# Patient Record
Sex: Female | Born: 1940 | Race: White | Hispanic: No | Marital: Married | Smoking: Never smoker
Health system: Southern US, Community
[De-identification: ages and names within clinical notes are randomized; demographics above are authoritative.]

## PROBLEM LIST (undated history)

## (undated) DIAGNOSIS — E785 Hyperlipidemia, unspecified: Secondary | ICD-10-CM

## (undated) DIAGNOSIS — R079 Chest pain, unspecified: Secondary | ICD-10-CM

## (undated) DIAGNOSIS — K311 Adult hypertrophic pyloric stenosis: Secondary | ICD-10-CM

## (undated) DIAGNOSIS — I447 Left bundle-branch block, unspecified: Secondary | ICD-10-CM

## (undated) DIAGNOSIS — J339 Nasal polyp, unspecified: Secondary | ICD-10-CM

## (undated) DIAGNOSIS — I6529 Occlusion and stenosis of unspecified carotid artery: Secondary | ICD-10-CM

## (undated) HISTORY — DX: Adult hypertrophic pyloric stenosis: K31.1

## (undated) HISTORY — DX: Left bundle-branch block, unspecified: I44.7

## (undated) HISTORY — DX: Nasal polyp, unspecified: J33.9

## (undated) HISTORY — DX: Chest pain, unspecified: R07.9

## (undated) HISTORY — DX: Hyperlipidemia, unspecified: E78.5

## (undated) HISTORY — DX: Occlusion and stenosis of unspecified carotid artery: I65.29

---

## 1990-09-18 HISTORY — PX: OTHER SURGICAL HISTORY: SHX169

## 2001-09-27 ENCOUNTER — Other Ambulatory Visit: Admission: RE | Admit: 2001-09-27 | Discharge: 2001-09-27 | Payer: Self-pay | Admitting: *Deleted

## 2003-10-19 ENCOUNTER — Other Ambulatory Visit: Admission: RE | Admit: 2003-10-19 | Discharge: 2003-10-19 | Payer: Self-pay | Admitting: *Deleted

## 2006-01-29 ENCOUNTER — Other Ambulatory Visit: Admission: RE | Admit: 2006-01-29 | Discharge: 2006-01-29 | Payer: Self-pay | Admitting: *Deleted

## 2007-09-19 HISTORY — PX: NASAL POLYP SURGERY: SHX186

## 2008-09-02 ENCOUNTER — Other Ambulatory Visit: Admission: RE | Admit: 2008-09-02 | Discharge: 2008-09-02 | Payer: Self-pay | Admitting: Family Medicine

## 2013-07-08 ENCOUNTER — Encounter (HOSPITAL_COMMUNITY): Payer: Self-pay | Admitting: Emergency Medicine

## 2013-07-08 ENCOUNTER — Emergency Department (HOSPITAL_COMMUNITY): Payer: Medicare Other

## 2013-07-08 ENCOUNTER — Observation Stay (HOSPITAL_COMMUNITY)
Admission: EM | Admit: 2013-07-08 | Discharge: 2013-07-09 | Disposition: A | Discharge status: Home / Self Care | Payer: Medicare Other | Attending: Internal Medicine | Admitting: Internal Medicine

## 2013-07-08 DIAGNOSIS — Z79899 Other long term (current) drug therapy: Secondary | ICD-10-CM | POA: Insufficient documentation

## 2013-07-08 DIAGNOSIS — R42 Dizziness and giddiness: Secondary | ICD-10-CM | POA: Insufficient documentation

## 2013-07-08 DIAGNOSIS — R0602 Shortness of breath: Secondary | ICD-10-CM | POA: Insufficient documentation

## 2013-07-08 DIAGNOSIS — I447 Left bundle-branch block, unspecified: Secondary | ICD-10-CM | POA: Diagnosis present

## 2013-07-08 DIAGNOSIS — R0789 Other chest pain: Principal | ICD-10-CM | POA: Insufficient documentation

## 2013-07-08 DIAGNOSIS — R079 Chest pain, unspecified: Secondary | ICD-10-CM | POA: Diagnosis present

## 2013-07-08 DIAGNOSIS — K219 Gastro-esophageal reflux disease without esophagitis: Secondary | ICD-10-CM | POA: Diagnosis present

## 2013-07-08 DIAGNOSIS — Z7982 Long term (current) use of aspirin: Secondary | ICD-10-CM | POA: Insufficient documentation

## 2013-07-08 LAB — BASIC METABOLIC PANEL
BUN: 16 mg/dL (ref 6–23)
CO2: 29 mEq/L (ref 19–32)
Chloride: 101 mEq/L (ref 96–112)
Creatinine, Ser: 0.77 mg/dL (ref 0.50–1.10)
GFR calc non Af Amer: 84 mL/min — ABNORMAL LOW (ref >90)
Glucose, Bld: 86 mg/dL (ref 70–99)
Potassium: 3.8 mEq/L (ref 3.5–5.1)
Sodium: 139 mEq/L (ref 135–145)

## 2013-07-08 LAB — CBC WITH DIFFERENTIAL/PLATELET
Eosinophils Absolute: 0.2 10*3/uL (ref 0.0–0.7)
Hemoglobin: 13.2 g/dL (ref 12.0–15.0)
Lymphocytes Relative: 39 % (ref 12–46)
Lymphs Abs: 2.5 10*3/uL (ref 0.7–4.0)
MCH: 30.8 pg (ref 26.0–34.0)
MCHC: 34.6 g/dL (ref 30.0–36.0)
Monocytes Relative: 10 % (ref 3–12)
Neutro Abs: 3.2 10*3/uL (ref 1.7–7.7)
Neutrophils Relative %: 48 % (ref 43–77)
Platelets: 167 10*3/uL (ref 150–400)
RBC: 4.28 MIL/uL (ref 3.87–5.11)
WBC: 6.5 10*3/uL (ref 4.0–10.5)

## 2013-07-08 LAB — TROPONIN I: Troponin I: <0.3 ng/mL (ref <0.30)

## 2013-07-08 MED ORDER — ENOXAPARIN SODIUM 40 MG/0.4ML ~~LOC~~ SOLN
40.0000 mg | SUBCUTANEOUS | Status: DC
  Administered 2013-07-08: 40 mg via SUBCUTANEOUS
  Filled 2013-07-08 (×2): qty 0.4

## 2013-07-08 MED ORDER — ACETAMINOPHEN 325 MG PO TABS: 650.0000 mg | ORAL_TABLET | Freq: Four times a day (QID) | ORAL | Status: DC | PRN

## 2013-07-08 MED ORDER — ACETAMINOPHEN 650 MG RE SUPP: 650.0000 mg | Freq: Four times a day (QID) | RECTAL | Status: DC | PRN

## 2013-07-08 MED ORDER — ASPIRIN EC 81 MG PO TBEC
81.0000 mg | DELAYED_RELEASE_TABLET | Freq: Every day | ORAL | Status: DC
  Administered 2013-07-09: 81 mg via ORAL
  Filled 2013-07-08: qty 1

## 2013-07-08 NOTE — ED Notes (Signed)
Pt returned from xray

## 2013-07-08 NOTE — H&P (Signed)
[facility] History and Physical  Jodi Owen UXL:[id number] DOB: September 07, 1941 DOA: 07/08/2013  Referring physician: Bernette Mayers PCP: Gaye Alken, MD    Chief Complaint: chest pain  HPI: Jodi Owen is a 72 y.o. female presents with substernal chest pressure. She was driving when it occurred. She felt lightheaded and weak. She had no palpitations. She took for baby aspirin this. The pain resolved after about 30 minutes. In the emergency room, she had EKG which showed left bundle branch block (old). Troponin normal. She had a stress test about a year ago with [facility] cardiology which was normal according to Dr. Mayford Knife.   Review of Systems: systems reviewed. As above. Otherwise negative  History reviewed. No pertinent past medical history. Surgical hx: surgery for pyloric stenosis as a child  Social History:  reports that she has never smoked. She does not have any smokeless tobacco history on file. She reports that she drinks alcohol. She reports that she does not use illicit drugs. Married. Retired principal.   Allergies  Allergen Reactions  . Codeine     hallucinations    Family History  Problem Relation Age of Onset  . AAA (abdominal aortic aneurysm) Mother     weight  . Hypertension Mother   . Lung cancer Father     Prior to Admission medications   Medication Sig Start Date End Date Taking? Authorizing Provider  aspirin EC 81 MG tablet Take 81 mg by mouth daily.   Yes Historical Provider, MD  Cholecalciferol (VITAMIN D) 2000 UNITS tablet Take 2,000 Units by mouth daily.   Yes Historical Provider, MD  fish oil-omega-3 fatty acids 1000 MG capsule Take 1 g by mouth daily.   Yes Historical Provider, MD  Multiple Vitamin (MULTIVITAMIN WITH MINERALS) TABS tablet Take 1 tablet by mouth daily.   Yes Historical Provider, MD   Physical Exam: Filed Vitals:   07/08/13 1623  BP: 137/86  Pulse: 69  Temp:   Resp: 15   BP 137/86  Pulse 69  Temp(Src) 98.3 F  (36.8 C)  Resp 15  SpO2 99%  General Appearance:    Alert, cooperative, no distress, appears stated age  Head:    Normocephalic, without obvious abnormality, atraumatic  Eyes:    PERRL, conjunctiva/corneas clear, EOM's intact, fundi    benign, both eyes  Ears:    Normal TM's and external ear canals, both ears  Nose:   Nares normal, septum midline, mucosa normal, no drainage    or sinus tenderness  Throat:   Lips, mucosa, and tongue normal; teeth and gums normal  Neck:   Supple, symmetrical, trachea midline, no adenopathy;    thyroid:  no enlargement/tenderness/nodules; no carotid   bruit or JVD  Back:     Symmetric, no curvature, ROM normal, no CVA tenderness  Lungs:     Clear to auscultation bilaterally, respirations unlabored  Chest Wall:    No tenderness or deformity   Heart:    Regular rate and rhythm, S1 and S2 normal, no murmur, rub   or gallop     Abdomen:     Soft, non-tender, bowel sounds active all four quadrants,    no masses, no organomegaly  Genitalia:  deferred  Rectal:  deferred  Extremities:   Extremities normal, atraumatic, no cyanosis or edema  Pulses:   2+ and symmetric all extremities  Skin:   Skin color, texture, turgor normal, no rashes or lesions  Lymph nodes:   Cervical, supraclavicular, and axillary nodes normal  Neurologic:   CNII-XII intact, normal strength, sensation and reflexes    throughout    Psych: normal affect  Labs on Admission:  Basic Metabolic Panel:  Recent Labs Lab 07/08/13 1442  NA 139  K 3.8  CL 101  CO2 29  GLUCOSE 86  BUN 16  CREATININE 0.77  CALCIUM 9.5   Liver Function Tests: No results found for this basename: AST, ALT, ALKPHOS, BILITOT, PROT, ALBUMIN,  in the last 168 hours No results found for this basename: LIPASE, AMYLASE,  in the last 168 hours No results found for this basename: AMMONIA,  in the last 168 hours CBC:  Recent Labs Lab 07/08/13 1442  WBC 6.5  NEUTROABS 3.2  HGB 13.2  HCT 38.2  MCV 89.3   PLT 167   Cardiac Enzymes:  Recent Labs Lab 07/08/13 1442  TROPONINI <0.30    BNP (last 3 results) No results found for this basename: PROBNP,  in the last 8760 hours CBG: No results found for this basename: GLUCAP,  in the last 168 hours  Radiological Exams on Admission: Dg Chest 2 View  07/08/2013   CLINICAL DATA:  Weakness, chest pain  EXAM: CHEST  2 VIEW  COMPARISON:  None.  FINDINGS: Cardiomediastinal silhouette is unremarkable. No acute infiltrate or pleural effusion. No pulmonary edema. Mild hyperinflation. Bony thorax is unremarkable. There is nodular calcification in right nipple region. Repeat frontal view with nipple markers recommended for confirmation.  IMPRESSION: No acute infiltrate or pleural effusion. No pulmonary edema. Mild hyperinflation. Bony thorax is unremarkable. There is nodular calcification in right nipple region. Repeat frontal view with nipple markers recommended for confirmation.   Electronically Signed   By: Natasha Mead M.D.   On: 07/08/2013 15:23    EKG: NSR. LBBB  Assessment/Plan Principal Problem:   Chest pain r/o MI. Atypical. obs on tele. Home in am if negative.  Code Status: full Family Communication: none Disposition Plan: home  Time spent: 45 min  Kendi Defalco L [facility] Pager [phone]  If 7PM-7AM, please contact night-coverage [email or web address] Password [facility] 07/08/2013, 4:45 PM

## 2013-07-08 NOTE — ED Notes (Signed)
Patient transported to X-ray 

## 2013-07-08 NOTE — ED Provider Notes (Signed)
CSN: [id number]     Arrival date & time 07/08/13  1415 History   First MD Initiated Contact with Patient 07/08/13 1412     Chief Complaint  Patient presents with  . Chest Pain   (Consider location/radiation/quality/duration/timing/severity/associated sxs/prior Treatment) Patient is a 72 y.o. female presenting with chest pain.  Chest Pain  Pt with no significant PMH reports she had onset of moderate to severe mid chest pressure associated with SOB and lightheadedness while driving. She took 324mg  ASA at that time and drove back to her [facility]. She was seen in the clinic there and told her BP was a little high. EMS was called and found LBBB but she is no longer having any pain. She feels back to baseline now. No known history of LBBB. States she had Cardiolyte stress test at [place] about a year ago. Told it was normal. Never told she had any EKG abnormalities.  History reviewed. No pertinent past medical history. History reviewed. No pertinent past surgical history. No family history on file. History  Substance Use Topics  . Smoking status: Never Smoker   . Smokeless tobacco: Not on file  . Alcohol Use: Yes     Comment: social   OB History   Grav Para Term Preterm Abortions TAB SAB Ect Mult Living                 Review of Systems  Cardiovascular: Positive for chest pain.   All other systems reviewed and are negative except as noted in HPI.   Allergies  Codeine  Home Medications   Current Outpatient Rx  Name  Route  Sig  Dispense  Refill  . aspirin EC 81 MG tablet   Oral   Take 81 mg by mouth daily.         . Cholecalciferol (VITAMIN D) 2000 UNITS tablet   Oral   Take 2,000 Units by mouth daily.         . fish oil-omega-3 fatty acids 1000 MG capsule   Oral   Take 1 g by mouth daily.         . Multiple Vitamin (MULTIVITAMIN WITH MINERALS) TABS tablet   Oral   Take 1 tablet by mouth daily.          BP 152/69  Pulse 71  Temp(Src) 98.3 F (36.8 C)  Resp 16   SpO2 96% Physical Exam  Nursing note and vitals reviewed. Constitutional: She is oriented to person, place, and time. She appears well-developed and well-nourished.  HENT:  Head: Normocephalic and atraumatic.  Eyes: EOM are normal. Pupils are equal, round, and reactive to light.  Neck: Normal range of motion. Neck supple.  Cardiovascular: Normal rate, normal heart sounds and intact distal pulses.   Pulmonary/Chest: Effort normal and breath sounds normal.  Abdominal: Bowel sounds are normal. She exhibits no distension. There is no tenderness.  Musculoskeletal: Normal range of motion. She exhibits no edema and no tenderness.  Neurological: She is alert and oriented to person, place, and time. She has normal strength. No cranial nerve deficit or sensory deficit.  Skin: Skin is warm and dry. No rash noted.  Psychiatric: She has a normal mood and affect.    ED Course  Procedures (including critical care time) Labs Review Labs Reviewed  CBC WITH DIFFERENTIAL  BASIC METABOLIC PANEL  TROPONIN I   Imaging Review No results found.  EKG Interpretation     Ventricular Rate:  70 PR Interval:  164 QRS Duration: 141  QT Interval:  448 QTC Calculation: 484 R Axis:   6 Text Interpretation:  Sinus rhythm Left bundle branch block No old tracing to compare ** ** Consider ACUTE MI if LBBB is new ** **            MDM   1. Chest pain     Pt with LBBB on EKG with none to compare, unsure if this is new, will attempt to get old EKG from [facility] clinic. She is symptom free now, so will hold off on activating Code STEMI. Labs and imaging pending. ASA take PTA.   3:28 PM Spoke with Dr. Mayford Knife who confirms EKG morphology similar today is similar to previous, although QRS is a little wider. Plan admission for rule out.    Elfego Giammarino B. Bernette Mayers, MD 07/08/13 [phone]

## 2013-07-08 NOTE — ED Notes (Signed)
PT reported CP while driving home today.Pt reported also feeling dizzy . Pt took 4- 81mg   ASA she had  in car and  Went to health clinic at [facility] . EMS was called to trans port to ED. ON arrival PT was CP free.

## 2013-07-08 NOTE — ED Notes (Signed)
Admitting doctor at the bedside doing assessment. Pt will be transported when MD is finished.

## 2013-07-09 DIAGNOSIS — R079 Chest pain, unspecified: Secondary | ICD-10-CM

## 2013-07-09 DIAGNOSIS — I447 Left bundle-branch block, unspecified: Secondary | ICD-10-CM

## 2013-07-09 DIAGNOSIS — K219 Gastro-esophageal reflux disease without esophagitis: Secondary | ICD-10-CM

## 2013-07-09 LAB — TROPONIN I: Troponin I: <0.3 ng/mL (ref <0.30)

## 2013-07-09 MED ORDER — NITROGLYCERIN 0.4 MG SL SUBL: 0.4000 mg | SUBLINGUAL_TABLET | SUBLINGUAL | Status: AC | PRN

## 2013-07-09 NOTE — Progress Notes (Signed)
Utilization review completed.  

## 2013-07-09 NOTE — Discharge Summary (Signed)
Physician Discharge Summary  Patient ID: Jodi Owen MRN: [id number] DOB/AGE: 1941-04-23 72 y.o.  Admit date: 07/08/2013 Discharge date: 07/09/2013  Primary Care Physician:  Gaye Alken, MD  Discharge Diagnoses:    . Chest pain . GERD (gastroesophageal reflux disease) . LBBB (left bundle branch block)- not new  Consults: Dr Donato Schultz via phone consultation   Recommendations for Outpatient Follow-up:  1. patient was recommended to followup with Dr. Carolanne Grumbling in the office for further evaluation  Allergies:   Allergies  Allergen Reactions  . Codeine     hallucinations     Discharge Medications:   Medication List         aspirin EC 81 MG tablet  Take 81 mg by mouth daily.     fish oil-omega-3 fatty acids 1000 MG capsule  Take 1 g by mouth daily.     multivitamin with minerals Tabs tablet  Take 1 tablet by mouth daily.     nitroGLYCERIN 0.4 MG SL tablet  Commonly known as:  NITROSTAT  Place 1 tablet (0.4 mg total) under the tongue every 5 (five) minutes as needed for chest pain.     Vitamin D 2000 UNITS tablet  Take 2,000 Units by mouth daily.         Brief H and P: For complete details please refer to admission H and P, but in brief  Patient is a 72 year old female who presented with substernal chest pressure. Patient reported that she was driving when it occurred, felt lightheaded and weak. She had no palpitations, she took a baby aspirin and the pain resolved after 30 minutes. In the ER patient had EKG which showed left bundle branch block (old), troponin normal, stress test about a year ago with [facility] cardiology was normal.  Hospital Course:   Atypical  Chest pain - Patient was admitted to telemetry on observation, she was ruled out for acute disease, cardiac enzymes remained negative. She was continued on aspirin 81 mg daily. I did discuss with Dr. Donato Schultz on phone who was able to review [facility] which showed normal EF, no ischemia, stress EKG had shown a left bundle branch block during the exercise. The EKG done during this hospitalization was similar to the prior EKG during the stress test. Patient also felt that her symptoms may be due to esophageal spasm. She will discuss with her primary care physician regarding that. She did not want to have any PPI at this time. I also recommended the patient to followup with Dr. Carolanne Grumbling.     Day of Discharge BP 121/74  Pulse 72  Temp(Src) 98 F (36.7 C) (Oral)  Resp 15  SpO2 100%  Physical Exam: General: Alert and awake oriented x3 not in any acute distress. HEENT: anicteric sclera, pupils reactive to light and accommodation CVS: S1-S2 clear no murmur rubs or gallops Chest: clear to auscultation bilaterally, no wheezing rales or rhonchi Abdomen: soft nontender, nondistended, normal bowel sounds, no organomegaly Extremities: no cyanosis, clubbing or edema noted bilaterally Neuro: Cranial nerves II-XII intact, no focal neurological deficits   The results of significant diagnostics from this hospitalization (including imaging, microbiology, ancillary and laboratory) are listed below for reference.    LAB RESULTS: Basic Metabolic Panel:  Recent Labs Lab 07/08/13 1442  NA 139  K 3.8  CL 101  CO2 29  GLUCOSE 86  BUN 16  CREATININE 0.77  CALCIUM 9.5   Liver  Function Tests: No results found for this basename: AST, ALT, ALKPHOS, BILITOT, PROT, ALBUMIN,  in the last 168 hours No results found for this basename: LIPASE, AMYLASE,  in the last 168 hours No results found for this basename: AMMONIA,  in the last 168 hours CBC:  Recent Labs Lab 07/08/13 1442  WBC 6.5  NEUTROABS 3.2  HGB 13.2  HCT 38.2  MCV 89.3  PLT 167   Cardiac Enzymes:  Recent Labs Lab 07/08/13 2253 07/09/13 0153  TROPONINI <0.30 <0.30   BNP: No components found with this basename: POCBNP,  CBG: No results found for  this basename: GLUCAP,  in the last 168 hours  Significant Diagnostic Studies:  Dg Chest 2 View  07/08/2013   CLINICAL DATA:  Weakness, chest pain  EXAM: CHEST  2 VIEW  COMPARISON:  None.  FINDINGS: Cardiomediastinal silhouette is unremarkable. No acute infiltrate or pleural effusion. No pulmonary edema. Mild hyperinflation. Bony thorax is unremarkable. There is nodular calcification in right nipple region. Repeat frontal view with nipple markers recommended for confirmation.  IMPRESSION: No acute infiltrate or pleural effusion. No pulmonary edema. Mild hyperinflation. Bony thorax is unremarkable. There is nodular calcification in right nipple region. Repeat frontal view with nipple markers recommended for confirmation.   Electronically Signed   By: Natasha Mead M.D.   On: 07/08/2013 15:23       Disposition and Follow-up:     Discharge Orders   Future Orders Complete By Expires   Diet - low sodium heart healthy  As directed    Discharge instructions  As directed    Comments:     1) Please schedule mammogram for this year (nodular calcification in nipple area) 2) Please discuss with Dr Mayford Knife regarding further evaluation of left bundle branch block/ chest pain. Also discuss the possibility of esophageal spasm with Dr Zachery Dauer.   Increase activity slowly  As directed        DISPOSITION:  home DIET: Heart healthy diet ACTIVITY: as tolerated   DISCHARGE FOLLOW-UP Follow-up Information   Follow up with Gaye Alken, MD. Schedule an appointment as soon as possible for a visit in 10 days.   Specialty:  Family Medicine   Contact information:   [address] [place] [address] [phone]       Follow up with Quintella Reichert, MD. Schedule an appointment as soon as possible for a visit in 2 weeks. (for follow-up)    Specialty:  Cardiology   Contact information:   [address] [place] [address] [phone]       Time spent on Discharge: 35  mins  Signed:   Lounette Sloan M.D. [facility] 07/09/2013, 9:54 AM Pager: [phone]

## 2013-07-09 NOTE — Progress Notes (Signed)
Pt provided with dc instructions and education. Pt verbalized understanding. Pt has no questions at this time. Iv removed with tip intact. Hear tmonitor cleaned and returned to front. [employer], Rn

## 2013-07-26 ENCOUNTER — Encounter: Payer: Self-pay | Admitting: Cardiology

## 2013-07-30 ENCOUNTER — Ambulatory Visit (INDEPENDENT_AMBULATORY_CARE_PROVIDER_SITE_OTHER): Payer: Medicare Other | Admitting: Cardiology

## 2013-07-30 ENCOUNTER — Encounter: Payer: Self-pay | Admitting: Cardiology

## 2013-07-30 VITALS — BP 112/80 | HR 72 | Ht 63.0 in | Wt 130.4 lb

## 2013-07-30 DIAGNOSIS — R0789 Other chest pain: Secondary | ICD-10-CM

## 2013-07-30 DIAGNOSIS — I447 Left bundle-branch block, unspecified: Secondary | ICD-10-CM

## 2013-07-30 DIAGNOSIS — K224 Dyskinesia of esophagus: Secondary | ICD-10-CM | POA: Insufficient documentation

## 2013-07-30 DIAGNOSIS — E785 Hyperlipidemia, unspecified: Secondary | ICD-10-CM | POA: Insufficient documentation

## 2013-07-30 NOTE — Patient Instructions (Signed)
Your physician recommends that you continue on your current medications as directed. Please refer to the Current Medication list given to you today.  Your physician wants you to follow-up in: 6 Months with Dr Turner You will receive a reminder letter in the mail two months in advance. If you don't receive a letter, please call our office to schedule the follow-up appointment.  

## 2013-07-30 NOTE — Progress Notes (Signed)
[address] [place], [place]  [address] Phone: [phone] Fax:  [phone]  Date:  07/30/2013   ID:  Jodi Owen, DOB [birth year], MRN [id number]  PCP:  Gaye Alken, MD  Cardiologist:  Armanda Magic, MD   CC:  6 months followup of CP   History of Present Illness: Jodi Owen is a 72 y.o. female with a history of CP in 03/2012 with negative cardiac workup at that time with normal nuclear stress test and carotid artery bruit with no significant carotid artery stenosis who presents back today for followup after an ER visit for chest pain recently. Patient reported that she was driving when it occurred, felt lightheaded and weak. She had no palpitations, she took a baby aspirin and the pain resolved after 30 minutes. In the ER patient had EKG which showed left bundle branch block (old), troponin normal and she was ruled out for MI by cardiac enzymes. She was continued on aspirin 81 mg daily.The EKG done during this hospitalization was similar to the prior EKG during the stress test. Patient also felt that her symptoms may be due to esophageal spasm. She saw Dr. Zachery Dauer who felt the episode was most likely related to esophageal spasm.  Since that episode she has not had any further episodes.  She denies any SOB and exercises in a very high impact aerobics class 3 days weekly and never has any chest pain or SOB.      Wt Readings from Last 3 Encounters:  07/30/13 130 lb 6.4 oz (59.149 kg)     Past Medical History  Diagnosis Date  . Chest pain   . Pyloric stenosis   . Nasal polyp   . Hyperlipidemia      Current Outpatient Prescriptions  Medication Sig Dispense Refill  . aspirin EC 81 MG tablet Take 81 mg by mouth daily.      . Cholecalciferol (VITAMIN D) 2000 UNITS tablet Take 2,000 Units by mouth daily.      . Multiple Vitamin (MULTIVITAMIN WITH MINERALS) TABS tablet Take 1 tablet by mouth daily.      . nitroGLYCERIN (NITROSTAT) 0.4 MG SL tablet Place 1  tablet (0.4 mg total) under the tongue every 5 (five) minutes as needed for chest pain.  30 tablet  5  . fish oil-omega-3 fatty acids 1000 MG capsule Take 1 g by mouth daily.       No current facility-administered medications for this visit.    Allergies:    Allergies  Allergen Reactions  . Codeine     hallucinations    Social History:  The patient  reports that she has never smoked. She does not have any smokeless tobacco history on file. She reports that she drinks alcohol. She reports that she does not use illicit drugs.   Family History:  The patient's family history includes AAA (abdominal aortic aneurysm) in her mother; CVA in her mother; Hypertension in her mother; Lung cancer in her father.   ROS:  Please see the history of present illness.      All other systems reviewed and negative.   PHYSICAL EXAM: VS:  BP 112/80  Pulse 72  Ht 5\' 3"  (1.6 m)  Wt 130 lb 6.4 oz (59.149 kg)  BMI 23.11 kg/m2 Well nourished, well developed, in no acute distress HEENT: normal Neck: no JVD Cardiac:  normal S1, S2; RRR; no murmur Lungs:  clear to auscultation bilaterally, no wheezing, rhonchi or rales Abd: soft, nontender, no  hepatomegaly Ext: no edema Skin: warm and dry Neuro:  CNs 2-12 intact, no focal abnormalities noted      ASSESSMENT AND PLAN:  1. Atypical Chest pain with no reoccurence.  She had a cardiac workup a year ago that was normal.  She is able to do high impact aerobics 3 times weekly without any chest pain or SOB.  She has a history of esophageal spasm in the past which I suspect was the etiology of her episode.  I have recommended no further workup at this time and she was instructed to call if she has any further episodes. 2. Chronic LBBB 3. H/O esophageal spasm  Followup with me in 6 months  Signed, Armanda Magic, MD 07/30/2013 9:05 AM

## 2015-03-15 ENCOUNTER — Other Ambulatory Visit: Payer: Self-pay

## 2016-04-03 ENCOUNTER — Encounter (HOSPITAL_COMMUNITY): Payer: Self-pay | Admitting: Emergency Medicine

## 2016-04-03 ENCOUNTER — Emergency Department (HOSPITAL_COMMUNITY): Payer: Medicare Other

## 2016-04-03 DIAGNOSIS — Z79899 Other long term (current) drug therapy: Secondary | ICD-10-CM | POA: Insufficient documentation

## 2016-04-03 DIAGNOSIS — Z7982 Long term (current) use of aspirin: Secondary | ICD-10-CM | POA: Insufficient documentation

## 2016-04-03 DIAGNOSIS — R0789 Other chest pain: Secondary | ICD-10-CM | POA: Diagnosis not present

## 2016-04-03 LAB — BASIC METABOLIC PANEL
ANION GAP: 7 (ref 5–15)
BUN: 18 mg/dL (ref 6–20)
CO2: 27 mmol/L (ref 22–32)
Calcium: 9.3 mg/dL (ref 8.9–10.3)
Chloride: 103 mmol/L (ref 101–111)
Creatinine, Ser: 0.75 mg/dL (ref 0.44–1.00)
GFR calc Af Amer: >60 mL/min (ref >60)
GFR calc non Af Amer: >60 mL/min (ref >60)
GLUCOSE: 94 mg/dL (ref 65–99)
POTASSIUM: 3.7 mmol/L (ref 3.5–5.1)
SODIUM: 137 mmol/L (ref 135–145)

## 2016-04-03 LAB — CBC
HEMATOCRIT: 39 % (ref 36.0–46.0)
HEMOGLOBIN: 13.1 g/dL (ref 12.0–15.0)
MCH: 30.2 pg (ref 26.0–34.0)
MCHC: 33.6 g/dL (ref 30.0–36.0)
MCV: 89.9 fL (ref 78.0–100.0)
Platelets: 165 10*3/uL (ref 150–400)
RBC: 4.34 MIL/uL (ref 3.87–5.11)
RDW: 12.5 % (ref 11.5–15.5)
WBC: 5.7 10*3/uL (ref 4.0–10.5)

## 2016-04-03 LAB — I-STAT TROPONIN, ED: Troponin i, poc: 0 ng/mL (ref 0.00–0.08)

## 2016-04-03 NOTE — ED Notes (Signed)
Pt. reports intermittent mid chest pain with mild diaphoresis onset this evening , denies nausea or vomitting , no SOB or cough .

## 2016-04-04 ENCOUNTER — Emergency Department (HOSPITAL_COMMUNITY)
Admission: EM | Admit: 2016-04-04 | Discharge: 2016-04-04 | Disposition: A | Discharge status: Home / Self Care | Payer: Medicare Other | Attending: Emergency Medicine | Admitting: Emergency Medicine

## 2016-04-04 DIAGNOSIS — R079 Chest pain, unspecified: Secondary | ICD-10-CM

## 2016-04-04 LAB — I-STAT TROPONIN, ED: TROPONIN I, POC: 0 ng/mL (ref 0.00–0.08)

## 2016-04-04 NOTE — Discharge Instructions (Signed)

## 2016-04-04 NOTE — ED Provider Notes (Signed)
CSN: [id number]     Arrival date & time 04/03/16  2308 History  By signing my name below, I, Rosario Adie, attest that this documentation has been prepared under the direction and in the presence of Zadie Rhine, MD. Electronically Signed: Rosario Adie, ED Scribe. 04/04/2016. 3:02 AM.   Chief Complaint  Patient presents with  . Chest Pain   Patient is a 75 y.o. female presenting with chest pain. The history is provided by the patient. No language interpreter was used.  Chest Pain Pain location:  L chest and substernal area Pain quality: pressure   Pain radiates to:  Does not radiate Pain radiates to the back: no   Pain severity:  No pain Onset quality:  Sudden Timing:  Intermittent Progression:  Unchanged Chronicity:  Recurrent Context: at rest   Context: not breathing   Relieved by:  Nothing Ineffective treatments:  Aspirin (and drinking water) Associated symptoms: diaphoresis   Associated symptoms: no abdominal pain, no back pain, no dizziness, no nausea, no shortness of breath, not vomiting and no weakness   Risk factors: high cholesterol   Risk factors: no diabetes mellitus and no hypertension    HPI Comments: Jodi Owen is a 75 y.o. female with a PMHx of HLD and LBBB who presents to the Emergency Department complaining of sudden onset, unchanged, intermittent, pressure-like, middle to left sided chest pain x 1 week. Her last episode was ~9 hours prior to coming into the ED. She is not currently in any pain while in the ED. Pt was sitting at home while watching television during her episode of pain PTA. She reports that during her episodes that she typically drinks water and it will completely alleviate her pain, but notes that when she tried to drink water for her episode of pain today that it was not remedied. She also took 4  Asprin prior to coming into the ED with minimal relief of her pain. Pt states that she was initially not diaphoretic, but gradually  as she was transported to the ED she noticed that she was mildly diaphoretic. Pt also notes that at the time of her pain she took her BP at home and it was 181/90. States her baseline is typically 125/65-70. Pt has had a stress test approximately 3 years ago, and was cleared. No hx of PE/DVT, MI, or strokes. She notes that her mother died of a chest aneurysm; however no other family hx of heart problems. Pt denies SOB, nausea, emesis, weakness, dizziness, back pain, abdominal pain, or leg swelling.   Past Medical History  Diagnosis Date  . Chest pain   . Pyloric stenosis   . Nasal polyp   . Hyperlipidemia   . LBBB (left bundle branch block)    Past Surgical History  Procedure Laterality Date  . Nasal polyp surgery  2009  . Pyloric stenosis repair  1992   Family History  Problem Relation Age of Onset  . AAA (abdominal aortic aneurysm) Mother     weight  . Hypertension Mother   . CVA Mother   . Lung cancer Father    Social History  Substance Use Topics  . Smoking status: Never Smoker   . Smokeless tobacco: None  . Alcohol Use: Yes     Comment: social   OB History    No data available     Review of Systems  Constitutional: Positive for diaphoresis.  Respiratory: Negative for shortness of breath.   Cardiovascular: Positive for chest  pain. Negative for leg swelling.  Gastrointestinal: Negative for nausea, vomiting and abdominal pain.  Musculoskeletal: Negative for back pain.  Neurological: Negative for dizziness and weakness.  All other systems reviewed and are negative.  Allergies  Codeine  Home Medications   Prior to Admission medications   Medication Sig Start Date End Date Taking? Authorizing Provider  aspirin EC 81 MG tablet Take 81 mg by mouth daily.    Historical Provider, MD  Cholecalciferol (VITAMIN D) 2000 UNITS tablet Take 2,000 Units by mouth daily.    Historical Provider, MD  fish oil-omega-3 fatty acids 1000 MG capsule Take 1 g by mouth daily.     Historical Provider, MD  Multiple Vitamin (MULTIVITAMIN WITH MINERALS) TABS tablet Take 1 tablet by mouth daily.    Historical Provider, MD  nitroGLYCERIN (NITROSTAT) 0.4 MG SL tablet Place 1 tablet (0.4 mg total) under the tongue every 5 (five) minutes as needed for chest pain. 07/09/13   Ripudeep K Rai, MD   BP 141/70 mmHg  Pulse 65  Temp(Src) 98 F (36.7 C) (Oral)  Resp 13  Ht 5\' 3"  (1.6 m)  Wt 127 lb (57.607 kg)  BMI 22.50 kg/m2  SpO2 100%   Physical Exam CONSTITUTIONAL: Well developed/well nourished HEAD: Normocephalic/atraumatic EYES: EOMI/PERRL ENMT: Mucous membranes moist NECK: supple no meningeal signs SPINE/BACK:entire spine nontender CV: S1/S2 noted, no murmurs/rubs/gallops noted LUNGS: Lungs are clear to auscultation bilaterally, no apparent distress ABDOMEN: soft, nontender, no rebound or guarding, bowel sounds noted throughout abdomen GU:no cva tenderness NEURO: Pt is awake/alert/appropriate, moves all extremitiesx4.  No facial droop.   EXTREMITIES: pulses normal/equal, full ROM SKIN: warm, color normal PSYCH: no abnormalities of mood noted, alert and oriented to situation  ED Course  Procedures   DIAGNOSTIC STUDIES: Oxygen Saturation is 100% on RA, normal by my interpretation.   COORDINATION OF CARE: 3:00 AM-Discussed next steps with pt. Pt verbalized understanding and is agreeable with the plan.  3:58 AM Repeat troponin negative Pt insistent on going home and following up with cardiology She is very well appearing, and appears younger than stated age and has minimal health issues, but due to age, presence of LBBB and her history, heart score >3 and I advised admission She refuses and will call cariology later today We discussed strict return precautions Advised she can return at anytime I doubt PE/Dissection at this time  Labs Review Labs Reviewed  BASIC METABOLIC PANEL  CBC  I-STAT TROPOININ, ED  Rosezena Sensor, ED   Imaging Review Dg Chest  2 View  04/03/2016  CLINICAL DATA:  Chest pain since this evening EXAM: CHEST  2 VIEW COMPARISON:  07/08/2013 FINDINGS: Calcified granulomas in the right lower lobe and hilum. There is no edema, consolidation, effusion, or pneumothorax. Normal heart size mediastinal contours. Symmetric biapical pleural thickening. IMPRESSION: Stable.  No evidence of acute disease. Electronically Signed   By: Marnee Spring M.D.   On: 04/03/2016 23:35   I have personally reviewed and evaluated these images and lab results as part of my medical decision-making.   EKG Interpretation   Date/Time:  Monday April 03 2016 23:11:43 EDT Ventricular Rate:  65 PR Interval:  168 QRS Duration: 134 QT Interval:  446 QTC Calculation: 463 R Axis:   6 Text Interpretation:  Normal sinus rhythm Non-specific intra-ventricular  conduction block Abnormal ECG No significant change since last tracing  Confirmed by Bebe Shaggy  MD, Roston Grunewald ([id number]) on 04/04/2016 2:33:32 AM      MDM   Final diagnoses:  Chest pain, unspecified chest pain type    Nursing notes including past medical history and social history reviewed and considered in documentation xrays/imaging reviewed by myself and considered during evaluation Labs/vital reviewed myself and considered during evaluation   I personally performed the services described in this documentation, which was scribed in my presence. The recorded information has been reviewed and is accurate.       Zadie Rhineonald Shaquel Josephson, MD 04/04/16 0400

## 2017-11-16 IMAGING — CR DG CHEST 2V
2 series · 2 of 2 positions shown · non-contrast
Comparison: 07/08/2013

CLINICAL DATA: Chest pain since this evening

EXAM:
CHEST  2 VIEW

[chest pa]
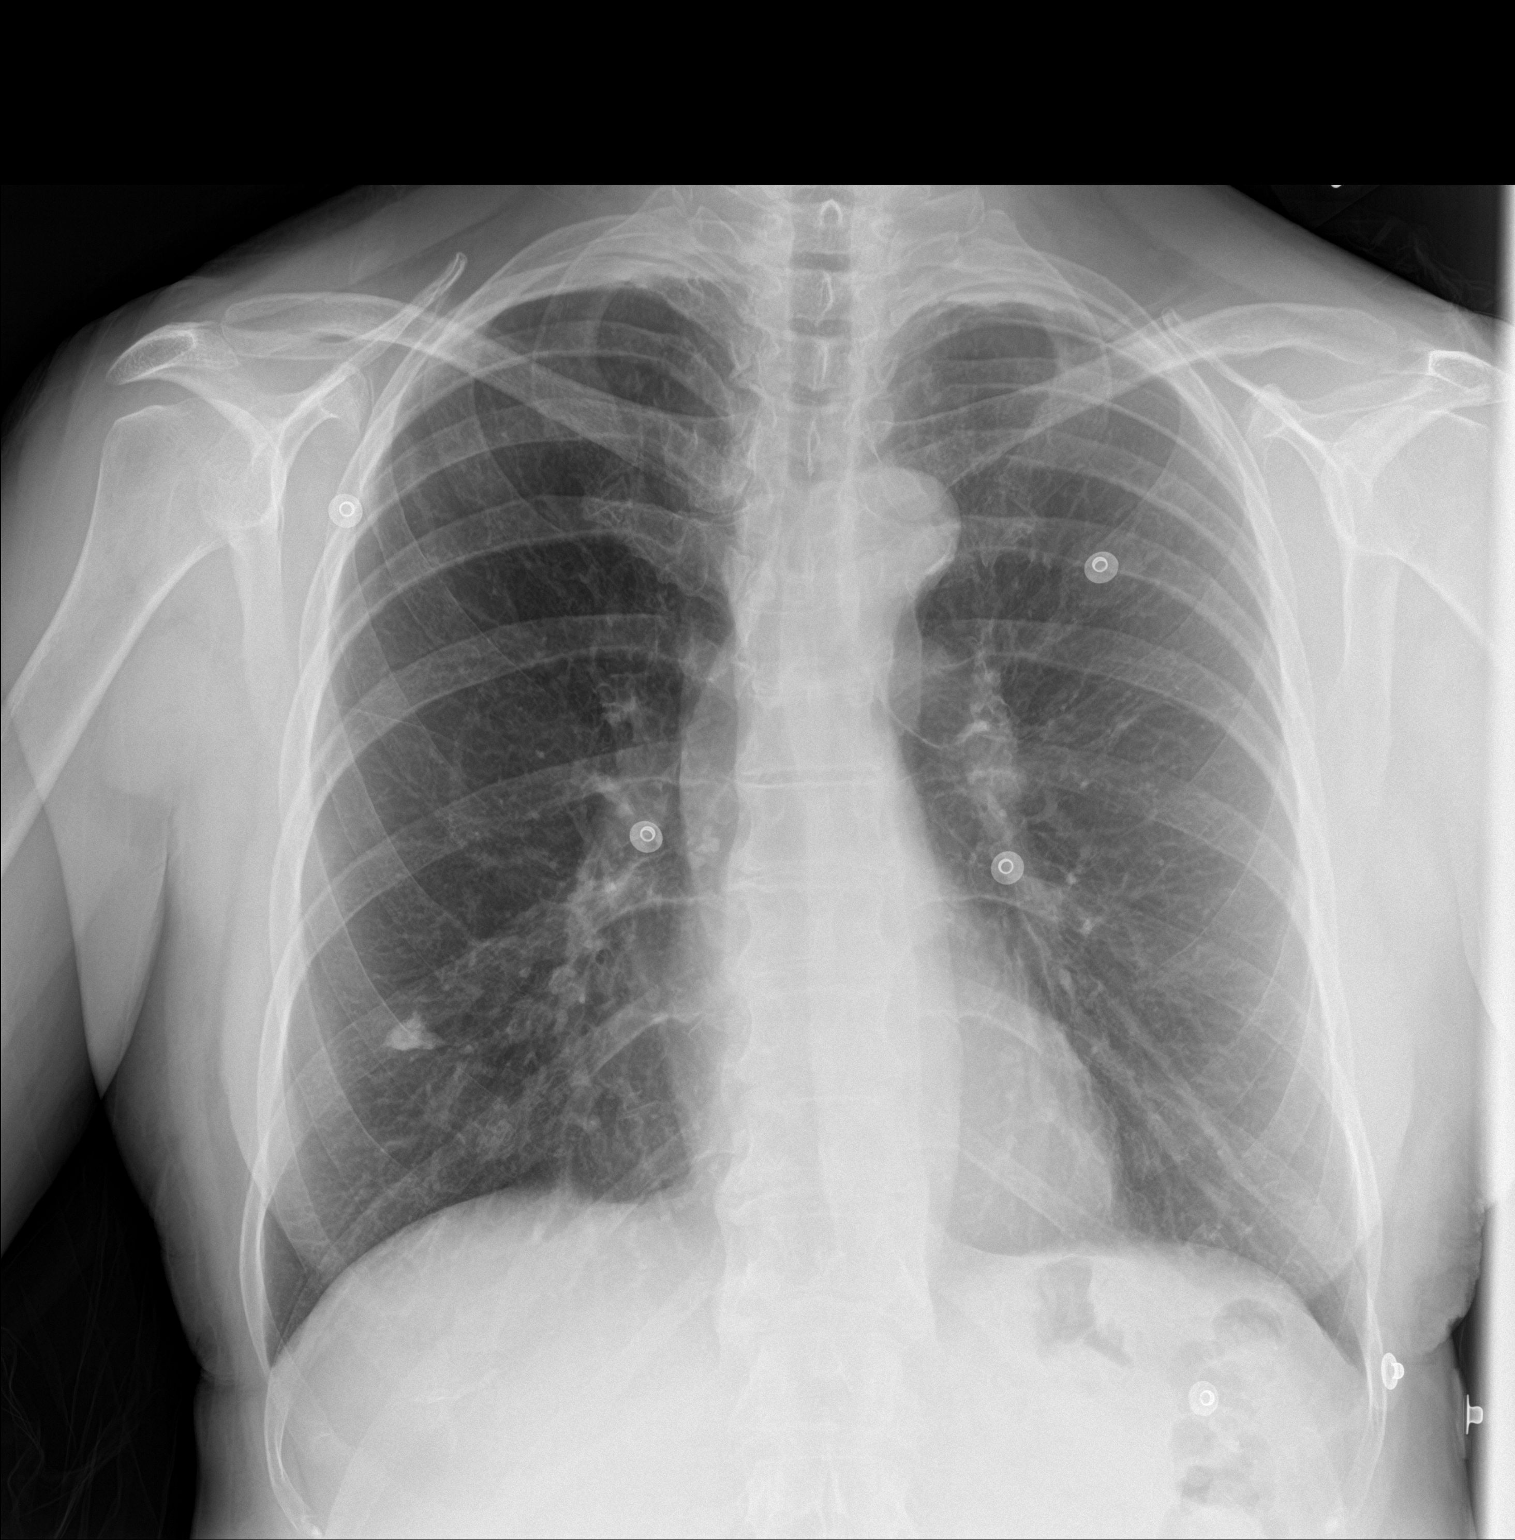

[chest lat]
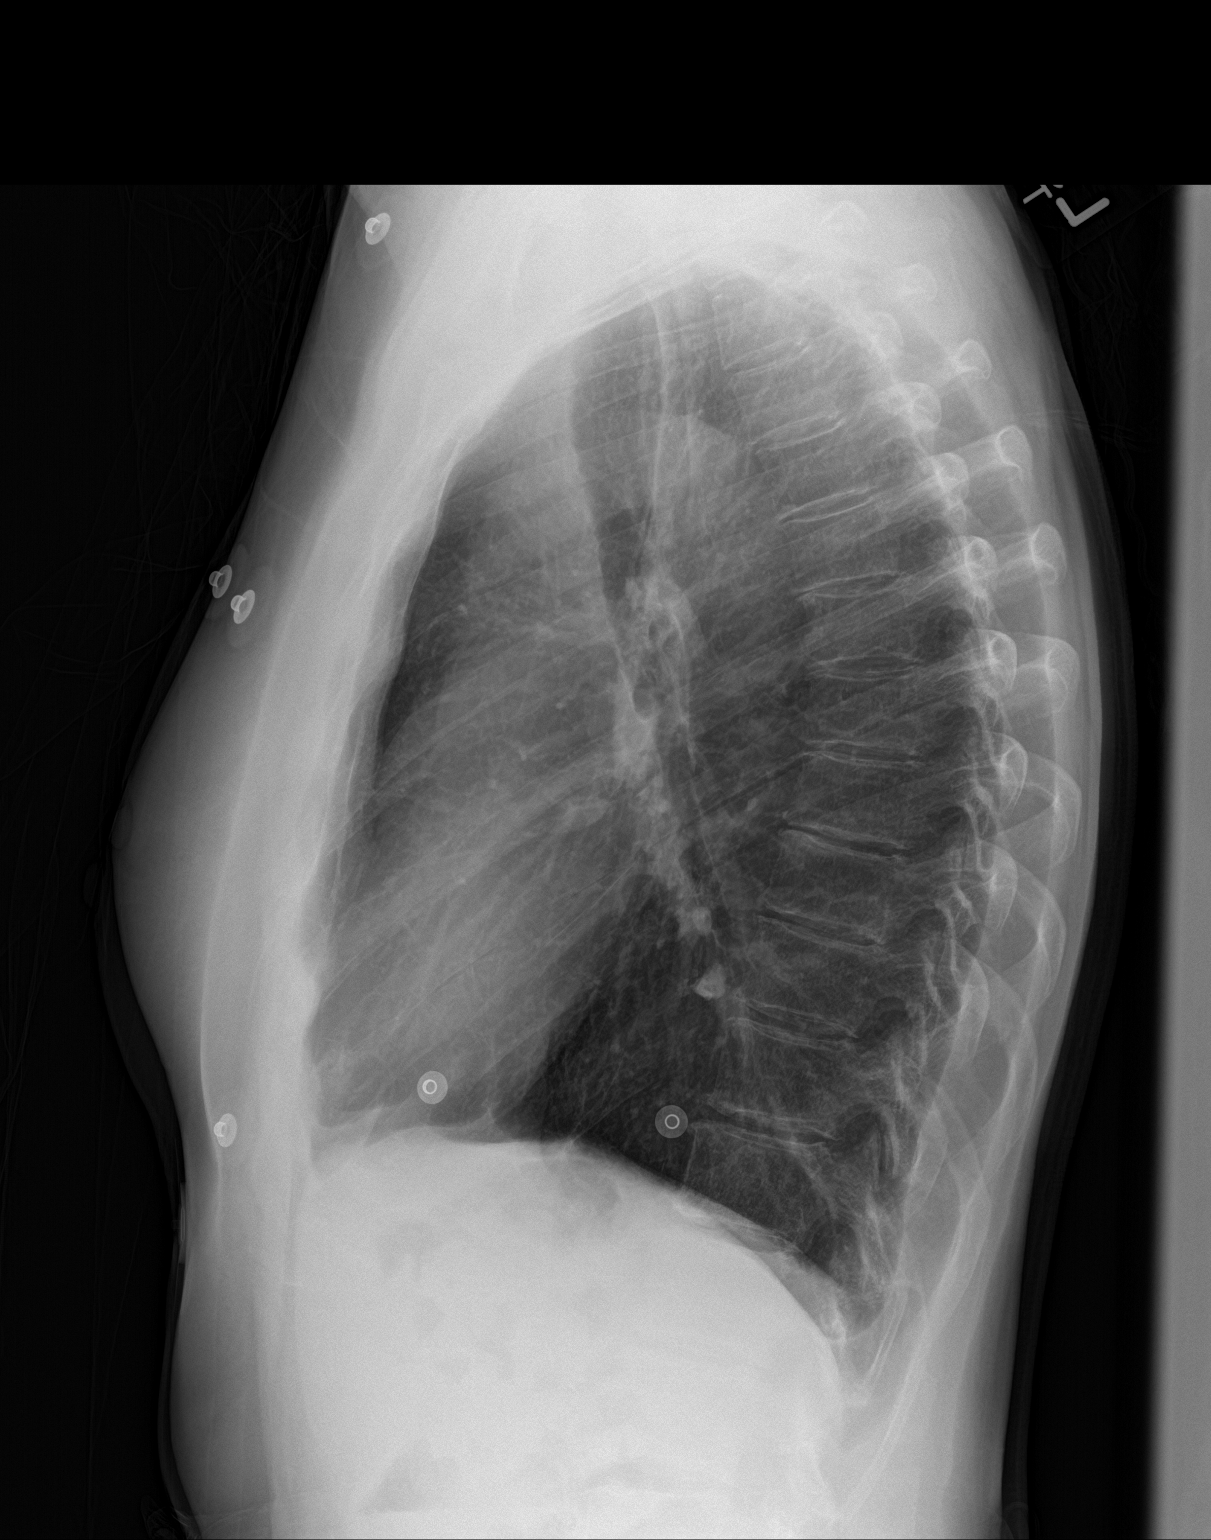

[2 of 2 positions shown; findings below may reference images not displayed]

FINDINGS: Calcified granulomas in the right lower lobe and hilum. There is no
edema, consolidation, effusion, or pneumothorax. Normal heart size
mediastinal contours. Symmetric biapical pleural thickening.
IMPRESSION: Stable.  No evidence of acute disease.

## 2018-08-19 ENCOUNTER — Other Ambulatory Visit: Payer: Self-pay | Admitting: Family Medicine

## 2018-08-19 DIAGNOSIS — R0989 Other specified symptoms and signs involving the circulatory and respiratory systems: Secondary | ICD-10-CM

## 2018-08-23 ENCOUNTER — Ambulatory Visit
Admission: RE | Admit: 2018-08-23 | Discharge: 2018-08-23 | Disposition: A | Discharge status: Home / Self Care | Payer: Medicare Other | Source: Ambulatory Visit | Attending: Family Medicine | Admitting: Family Medicine

## 2018-08-23 DIAGNOSIS — R0989 Other specified symptoms and signs involving the circulatory and respiratory systems: Secondary | ICD-10-CM

## 2018-12-16 IMAGING — US US CAROTID DUPLEX BILAT
1 series · 13 of 24 positions shown · non-contrast
Comparison: None.

CLINICAL DATA: Right carotid bruit

EXAM:
BILATERAL CAROTID DUPLEX ULTRASOUND
TECHNIQUE: Gray scale imaging, color Doppler and duplex ultrasound were
performed of bilateral carotid and vertebral arteries in the neck.

[Series 1: us carotid duplex bilat · 0.06mm/px · 13 of 75 slices shown]
[im 1/75]
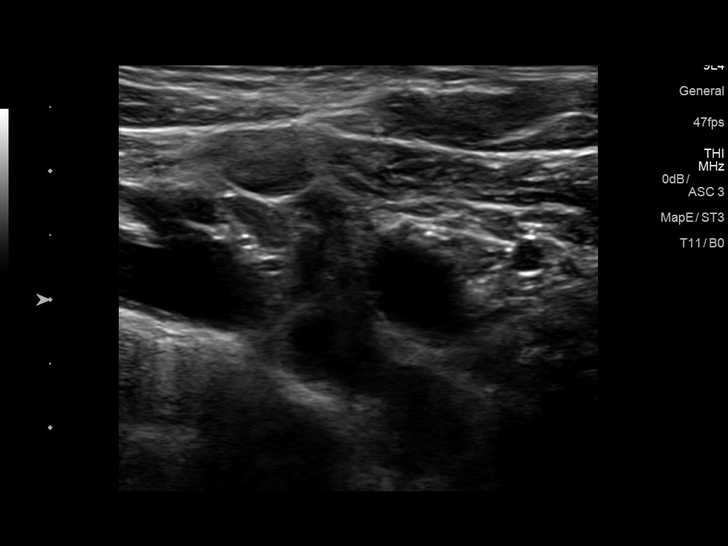
[im 7/75]
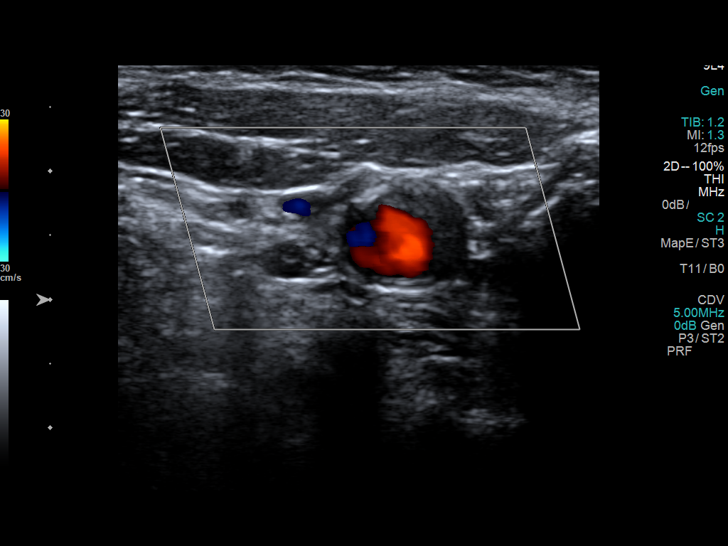
[im 13/75]
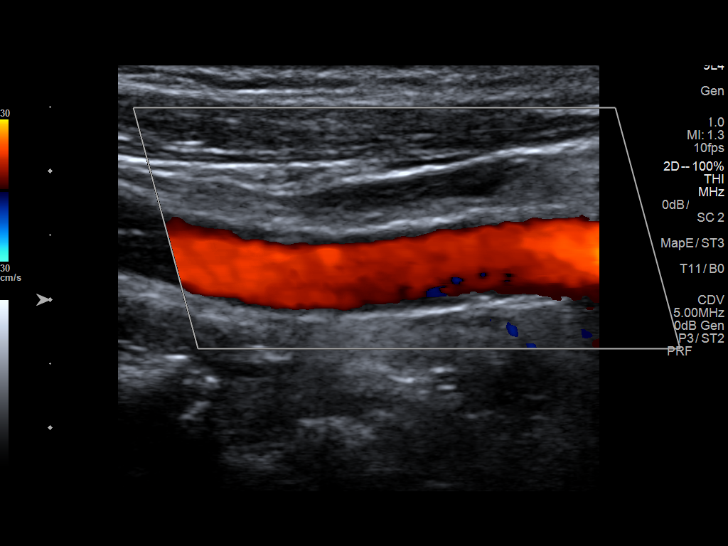
[im 20/75]
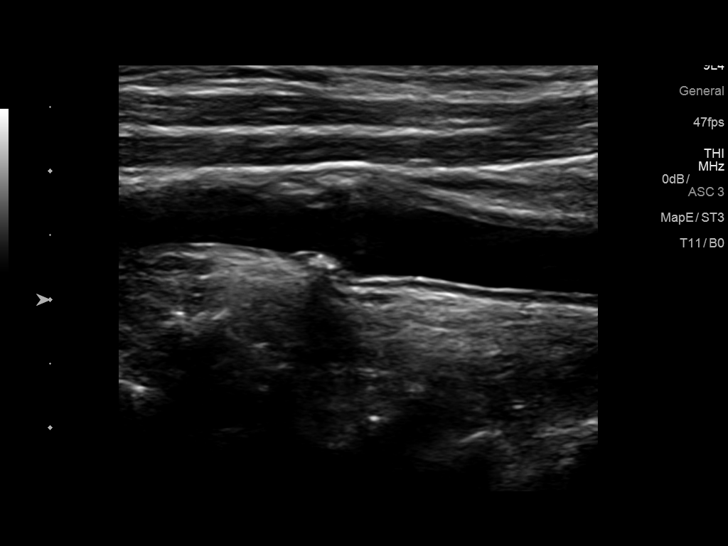
[im 26/75]
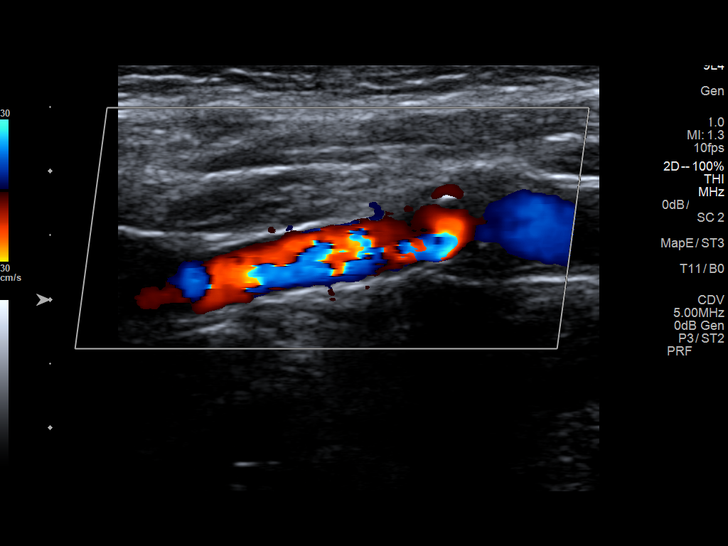
[im 33/75]
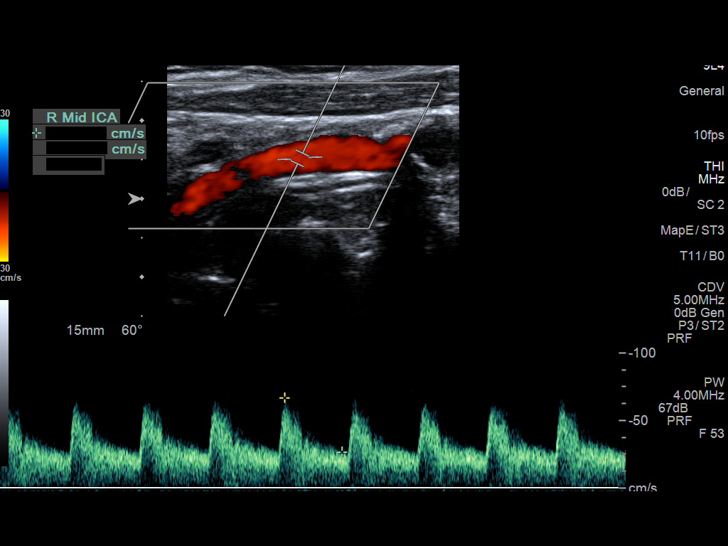
[im 39/75]
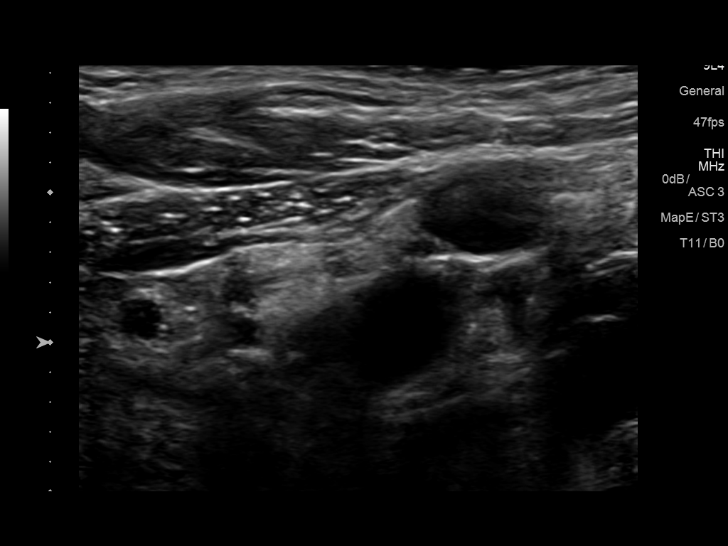
[im 42/75]
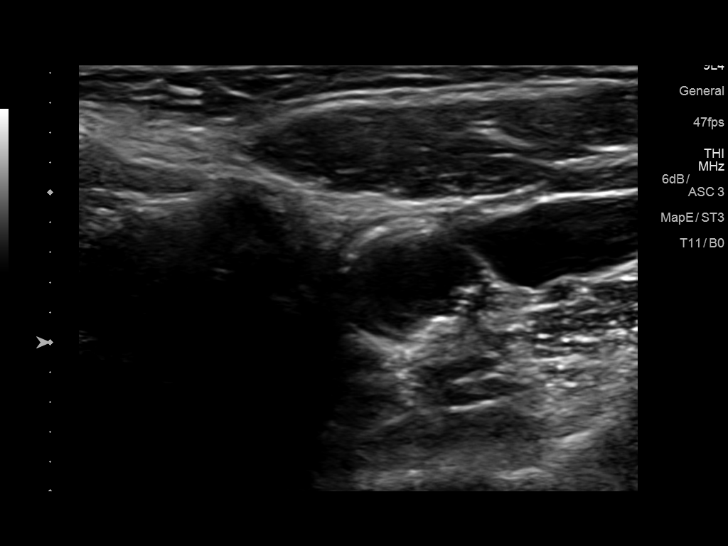
[im 49/75]
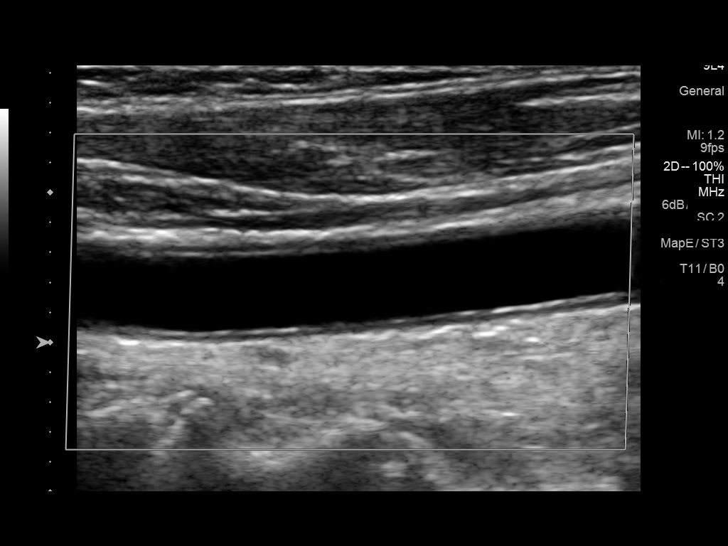
[im 55/75]
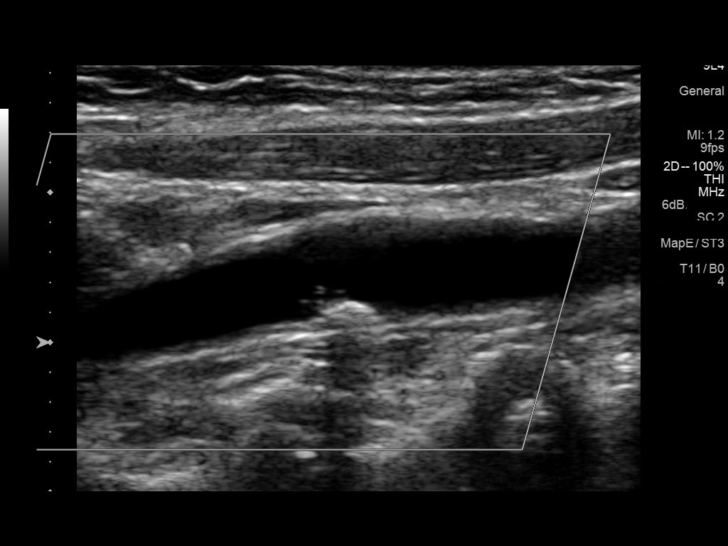
[im 62/75]
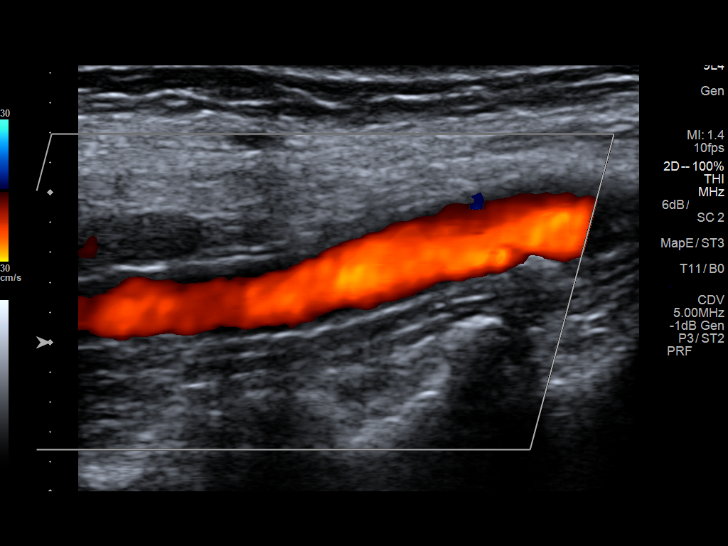
[im 68/75]
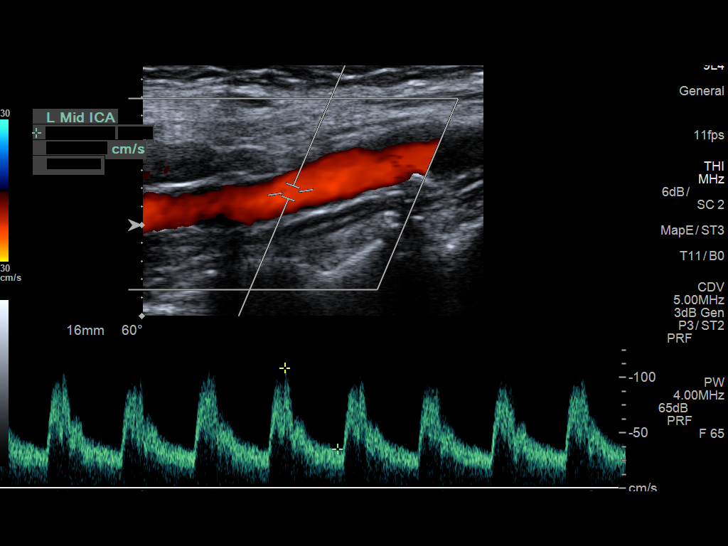
[im 75/75]
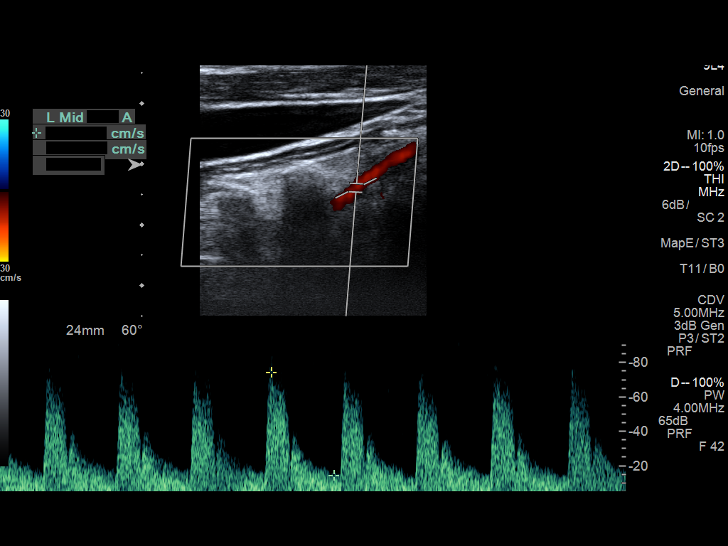

[13 of 24 positions shown; findings below may reference images not displayed]

FINDINGS: Criteria: Quantification of carotid stenosis is based on velocity
parameters that correlate the residual internal carotid diameter
with NASCET-based stenosis levels, using the diameter of the distal
internal carotid lumen as the denominator for stenosis measurement.

The following velocity measurements were obtained:

RIGHT

ICA: 71 cm/sec

CCA: 83 cm/sec

SYSTOLIC ICA/CCA RATIO:

ECA: 336 cm/sec

LEFT

ICA: 109 cm/sec

CCA: 102 cm/sec

SYSTOLIC ICA/CCA RATIO:

ECA: 110 cm/sec

RIGHT CAROTID ARTERY: Moderate smooth soft and calcified plaque in
the bulb. Low resistance internal carotid Doppler pattern. There is
prominent predominately soft but mixed plaque in the proximal right
external carotid artery.

RIGHT VERTEBRAL ARTERY:  Antegrade.

LEFT CAROTID ARTERY: Mild smooth soft plaque is associated with
focal calcified plaque in the bulb.

LEFT VERTEBRAL ARTERY:  Antegrade.

Upper extremity blood pressures: RIGHT: 117/62 LEFT: 122/57
IMPRESSION: Less than 50% stenosis in the right and left internal carotid
arteries. Plaque is noted in both bulbs.

Markedly elevated velocity in the right external carotid artery
likely the cause of the bruit.

## 2020-06-24 DIAGNOSIS — H5203 Hypermetropia, bilateral: Secondary | ICD-10-CM | POA: Diagnosis not present

## 2020-06-24 DIAGNOSIS — H2513 Age-related nuclear cataract, bilateral: Secondary | ICD-10-CM | POA: Diagnosis not present

## 2020-06-24 DIAGNOSIS — H25011 Cortical age-related cataract, right eye: Secondary | ICD-10-CM | POA: Diagnosis not present

## 2020-06-24 DIAGNOSIS — H524 Presbyopia: Secondary | ICD-10-CM | POA: Diagnosis not present

## 2020-08-10 DIAGNOSIS — K645 Perianal venous thrombosis: Secondary | ICD-10-CM | POA: Diagnosis not present

## 2020-08-11 DIAGNOSIS — K5900 Constipation, unspecified: Secondary | ICD-10-CM | POA: Diagnosis not present

## 2020-08-11 DIAGNOSIS — M542 Cervicalgia: Secondary | ICD-10-CM | POA: Diagnosis not present

## 2020-08-11 DIAGNOSIS — I6529 Occlusion and stenosis of unspecified carotid artery: Secondary | ICD-10-CM | POA: Diagnosis not present

## 2020-08-11 DIAGNOSIS — M8588 Other specified disorders of bone density and structure, other site: Secondary | ICD-10-CM | POA: Diagnosis not present

## 2020-08-11 DIAGNOSIS — E785 Hyperlipidemia, unspecified: Secondary | ICD-10-CM | POA: Diagnosis not present

## 2020-09-23 DIAGNOSIS — N952 Postmenopausal atrophic vaginitis: Secondary | ICD-10-CM | POA: Diagnosis not present

## 2020-09-23 DIAGNOSIS — K5900 Constipation, unspecified: Secondary | ICD-10-CM | POA: Diagnosis not present

## 2020-09-23 DIAGNOSIS — M8588 Other specified disorders of bone density and structure, other site: Secondary | ICD-10-CM | POA: Diagnosis not present

## 2020-09-23 DIAGNOSIS — E785 Hyperlipidemia, unspecified: Secondary | ICD-10-CM | POA: Diagnosis not present

## 2020-09-23 DIAGNOSIS — E559 Vitamin D deficiency, unspecified: Secondary | ICD-10-CM | POA: Diagnosis not present

## 2020-09-23 DIAGNOSIS — I6529 Occlusion and stenosis of unspecified carotid artery: Secondary | ICD-10-CM | POA: Diagnosis not present

## 2020-09-23 DIAGNOSIS — Z0000 Encounter for general adult medical examination without abnormal findings: Secondary | ICD-10-CM | POA: Diagnosis not present

## 2020-09-23 DIAGNOSIS — Z1389 Encounter for screening for other disorder: Secondary | ICD-10-CM | POA: Diagnosis not present

## 2020-09-30 DIAGNOSIS — Z0000 Encounter for general adult medical examination without abnormal findings: Secondary | ICD-10-CM | POA: Diagnosis not present

## 2020-09-30 DIAGNOSIS — K5900 Constipation, unspecified: Secondary | ICD-10-CM | POA: Diagnosis not present

## 2020-09-30 DIAGNOSIS — E559 Vitamin D deficiency, unspecified: Secondary | ICD-10-CM | POA: Diagnosis not present

## 2020-09-30 DIAGNOSIS — I6529 Occlusion and stenosis of unspecified carotid artery: Secondary | ICD-10-CM | POA: Diagnosis not present

## 2020-09-30 DIAGNOSIS — N952 Postmenopausal atrophic vaginitis: Secondary | ICD-10-CM | POA: Diagnosis not present

## 2020-09-30 DIAGNOSIS — M8588 Other specified disorders of bone density and structure, other site: Secondary | ICD-10-CM | POA: Diagnosis not present

## 2020-09-30 DIAGNOSIS — E785 Hyperlipidemia, unspecified: Secondary | ICD-10-CM | POA: Diagnosis not present

## 2020-11-09 DIAGNOSIS — H25012 Cortical age-related cataract, left eye: Secondary | ICD-10-CM | POA: Diagnosis not present

## 2020-11-09 DIAGNOSIS — H25812 Combined forms of age-related cataract, left eye: Secondary | ICD-10-CM | POA: Diagnosis not present

## 2020-11-09 DIAGNOSIS — H2512 Age-related nuclear cataract, left eye: Secondary | ICD-10-CM | POA: Diagnosis not present

## 2020-11-16 DIAGNOSIS — Z1231 Encounter for screening mammogram for malignant neoplasm of breast: Secondary | ICD-10-CM | POA: Diagnosis not present

## 2020-12-07 DIAGNOSIS — H25011 Cortical age-related cataract, right eye: Secondary | ICD-10-CM | POA: Diagnosis not present

## 2020-12-07 DIAGNOSIS — H2511 Age-related nuclear cataract, right eye: Secondary | ICD-10-CM | POA: Diagnosis not present

## 2020-12-07 DIAGNOSIS — H25811 Combined forms of age-related cataract, right eye: Secondary | ICD-10-CM | POA: Diagnosis not present

## 2020-12-23 DIAGNOSIS — M542 Cervicalgia: Secondary | ICD-10-CM | POA: Diagnosis not present

## 2021-01-06 DIAGNOSIS — M47892 Other spondylosis, cervical region: Secondary | ICD-10-CM | POA: Diagnosis not present

## 2021-01-06 DIAGNOSIS — R293 Abnormal posture: Secondary | ICD-10-CM | POA: Diagnosis not present

## 2021-01-06 DIAGNOSIS — M542 Cervicalgia: Secondary | ICD-10-CM | POA: Diagnosis not present

## 2021-01-06 DIAGNOSIS — M6281 Muscle weakness (generalized): Secondary | ICD-10-CM | POA: Diagnosis not present

## 2021-01-13 DIAGNOSIS — M6281 Muscle weakness (generalized): Secondary | ICD-10-CM | POA: Diagnosis not present

## 2021-01-13 DIAGNOSIS — M47892 Other spondylosis, cervical region: Secondary | ICD-10-CM | POA: Diagnosis not present

## 2021-01-13 DIAGNOSIS — R293 Abnormal posture: Secondary | ICD-10-CM | POA: Diagnosis not present

## 2021-01-13 DIAGNOSIS — M542 Cervicalgia: Secondary | ICD-10-CM | POA: Diagnosis not present

## 2021-01-25 DIAGNOSIS — M6281 Muscle weakness (generalized): Secondary | ICD-10-CM | POA: Diagnosis not present

## 2021-01-25 DIAGNOSIS — R293 Abnormal posture: Secondary | ICD-10-CM | POA: Diagnosis not present

## 2021-01-25 DIAGNOSIS — M542 Cervicalgia: Secondary | ICD-10-CM | POA: Diagnosis not present

## 2021-01-25 DIAGNOSIS — M47892 Other spondylosis, cervical region: Secondary | ICD-10-CM | POA: Diagnosis not present

## 2021-02-03 DIAGNOSIS — M6281 Muscle weakness (generalized): Secondary | ICD-10-CM | POA: Diagnosis not present

## 2021-02-03 DIAGNOSIS — M47892 Other spondylosis, cervical region: Secondary | ICD-10-CM | POA: Diagnosis not present

## 2021-02-03 DIAGNOSIS — M542 Cervicalgia: Secondary | ICD-10-CM | POA: Diagnosis not present

## 2021-02-03 DIAGNOSIS — R293 Abnormal posture: Secondary | ICD-10-CM | POA: Diagnosis not present

## 2021-02-10 DIAGNOSIS — M6281 Muscle weakness (generalized): Secondary | ICD-10-CM | POA: Diagnosis not present

## 2021-02-10 DIAGNOSIS — R293 Abnormal posture: Secondary | ICD-10-CM | POA: Diagnosis not present

## 2021-02-10 DIAGNOSIS — M542 Cervicalgia: Secondary | ICD-10-CM | POA: Diagnosis not present

## 2021-02-10 DIAGNOSIS — M47892 Other spondylosis, cervical region: Secondary | ICD-10-CM | POA: Diagnosis not present

## 2021-02-17 DIAGNOSIS — M6281 Muscle weakness (generalized): Secondary | ICD-10-CM | POA: Diagnosis not present

## 2021-02-17 DIAGNOSIS — R293 Abnormal posture: Secondary | ICD-10-CM | POA: Diagnosis not present

## 2021-02-17 DIAGNOSIS — M542 Cervicalgia: Secondary | ICD-10-CM | POA: Diagnosis not present

## 2021-02-17 DIAGNOSIS — M47892 Other spondylosis, cervical region: Secondary | ICD-10-CM | POA: Diagnosis not present

## 2021-02-24 DIAGNOSIS — M6281 Muscle weakness (generalized): Secondary | ICD-10-CM | POA: Diagnosis not present

## 2021-02-24 DIAGNOSIS — M47892 Other spondylosis, cervical region: Secondary | ICD-10-CM | POA: Diagnosis not present

## 2021-02-24 DIAGNOSIS — M542 Cervicalgia: Secondary | ICD-10-CM | POA: Diagnosis not present

## 2021-02-24 DIAGNOSIS — R293 Abnormal posture: Secondary | ICD-10-CM | POA: Diagnosis not present

## 2021-02-28 DIAGNOSIS — R293 Abnormal posture: Secondary | ICD-10-CM | POA: Diagnosis not present

## 2021-02-28 DIAGNOSIS — M47892 Other spondylosis, cervical region: Secondary | ICD-10-CM | POA: Diagnosis not present

## 2021-02-28 DIAGNOSIS — M6281 Muscle weakness (generalized): Secondary | ICD-10-CM | POA: Diagnosis not present

## 2021-02-28 DIAGNOSIS — M542 Cervicalgia: Secondary | ICD-10-CM | POA: Diagnosis not present

## 2021-03-03 DIAGNOSIS — R293 Abnormal posture: Secondary | ICD-10-CM | POA: Diagnosis not present

## 2021-03-03 DIAGNOSIS — M6281 Muscle weakness (generalized): Secondary | ICD-10-CM | POA: Diagnosis not present

## 2021-03-03 DIAGNOSIS — M542 Cervicalgia: Secondary | ICD-10-CM | POA: Diagnosis not present

## 2021-03-03 DIAGNOSIS — M47892 Other spondylosis, cervical region: Secondary | ICD-10-CM | POA: Diagnosis not present

## 2021-03-07 DIAGNOSIS — M47892 Other spondylosis, cervical region: Secondary | ICD-10-CM | POA: Diagnosis not present

## 2021-03-07 DIAGNOSIS — M542 Cervicalgia: Secondary | ICD-10-CM | POA: Diagnosis not present

## 2021-03-07 DIAGNOSIS — R293 Abnormal posture: Secondary | ICD-10-CM | POA: Diagnosis not present

## 2021-03-07 DIAGNOSIS — M6281 Muscle weakness (generalized): Secondary | ICD-10-CM | POA: Diagnosis not present

## 2021-03-14 DIAGNOSIS — M6281 Muscle weakness (generalized): Secondary | ICD-10-CM | POA: Diagnosis not present

## 2021-03-14 DIAGNOSIS — R293 Abnormal posture: Secondary | ICD-10-CM | POA: Diagnosis not present

## 2021-03-14 DIAGNOSIS — M47892 Other spondylosis, cervical region: Secondary | ICD-10-CM | POA: Diagnosis not present

## 2021-03-14 DIAGNOSIS — M542 Cervicalgia: Secondary | ICD-10-CM | POA: Diagnosis not present

## 2021-03-17 DIAGNOSIS — M542 Cervicalgia: Secondary | ICD-10-CM | POA: Diagnosis not present

## 2021-03-17 DIAGNOSIS — M47892 Other spondylosis, cervical region: Secondary | ICD-10-CM | POA: Diagnosis not present

## 2021-03-17 DIAGNOSIS — R293 Abnormal posture: Secondary | ICD-10-CM | POA: Diagnosis not present

## 2021-03-17 DIAGNOSIS — M6281 Muscle weakness (generalized): Secondary | ICD-10-CM | POA: Diagnosis not present

## 2021-03-21 DIAGNOSIS — M6281 Muscle weakness (generalized): Secondary | ICD-10-CM | POA: Diagnosis not present

## 2021-03-21 DIAGNOSIS — M47892 Other spondylosis, cervical region: Secondary | ICD-10-CM | POA: Diagnosis not present

## 2021-03-21 DIAGNOSIS — M542 Cervicalgia: Secondary | ICD-10-CM | POA: Diagnosis not present

## 2021-03-21 DIAGNOSIS — R293 Abnormal posture: Secondary | ICD-10-CM | POA: Diagnosis not present

## 2021-03-28 DIAGNOSIS — M542 Cervicalgia: Secondary | ICD-10-CM | POA: Diagnosis not present

## 2021-03-28 DIAGNOSIS — M47892 Other spondylosis, cervical region: Secondary | ICD-10-CM | POA: Diagnosis not present

## 2021-03-28 DIAGNOSIS — M6281 Muscle weakness (generalized): Secondary | ICD-10-CM | POA: Diagnosis not present

## 2021-03-28 DIAGNOSIS — R293 Abnormal posture: Secondary | ICD-10-CM | POA: Diagnosis not present

## 2021-04-04 DIAGNOSIS — M6281 Muscle weakness (generalized): Secondary | ICD-10-CM | POA: Diagnosis not present

## 2021-04-04 DIAGNOSIS — M542 Cervicalgia: Secondary | ICD-10-CM | POA: Diagnosis not present

## 2021-04-04 DIAGNOSIS — R293 Abnormal posture: Secondary | ICD-10-CM | POA: Diagnosis not present

## 2021-04-04 DIAGNOSIS — M47892 Other spondylosis, cervical region: Secondary | ICD-10-CM | POA: Diagnosis not present

## 2021-10-12 ENCOUNTER — Ambulatory Visit: Payer: Medicare PPO | Attending: Anesthesiology | Admitting: Physical Therapy

## 2021-10-12 ENCOUNTER — Encounter: Payer: Self-pay | Admitting: Physical Therapy

## 2021-10-12 ENCOUNTER — Other Ambulatory Visit: Payer: Self-pay

## 2021-10-12 DIAGNOSIS — M5412 Radiculopathy, cervical region: Secondary | ICD-10-CM | POA: Diagnosis not present

## 2021-10-12 DIAGNOSIS — M6281 Muscle weakness (generalized): Secondary | ICD-10-CM | POA: Insufficient documentation

## 2021-10-12 NOTE — Therapy (Signed)
[facility] [address] [place], [place], [address] Phone: [phone]   Fax:  [phone]  Physical Therapy Evaluation  Patient Details  Name: Jodi Owen MRN: [id number] Date of Birth: October 10, 1940 Referring Provider (PT): Gweneth Dimitri   Encounter Date: 10/12/2021   PT End of Session - 10/12/21 1431     Visit Number 1    Number of Visits 16    Date for PT Re-Evaluation 12/21/21    Authorization Type [employer]    PT Start Time 1316    PT Stop Time 1400    PT Time Calculation (min) 44 min    Activity Tolerance Patient limited by pain    Behavior During Therapy [facility] for tasks assessed/performed             Past Medical History:  Diagnosis Date   Chest pain    Hyperlipidemia    LBBB (left bundle branch block)    Nasal polyp    Pyloric stenosis     Past Surgical History:  Procedure Laterality Date   NASAL POLYP SURGERY  2009   pyloric stenosis repair  1992    There were no vitals filed for this visit.    Subjective Assessment - 10/12/21 1315     Subjective Three year H/O neck pain when turning her head. She delayed a Dr appointment due to Covid. 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 She received treatment for the pain and it imporved, but did not go away. She uses a special pillow fo rsleeping and performs neck exercises that were given to her from the other therapist.    How long can you sit comfortably? N/A as long as she doesnt have to turn her head.    Diagnostic tests Patient reports X ray from about a year ago which showed a reversed cervical curve.    Patient Stated Goals Decrease pain, inprove ability to turn head.    Currently in Pain? Yes    Pain Score 7     Pain Location Neck    Pain Orientation Right;Left    Pain Descriptors / Indicators Stabbing;Aching    Pain Type Chronic pain    Pain Radiating Towards R upper traps    Pain Onset  More than a month ago    Pain Frequency Intermittent    Aggravating Factors  turning head,    Pain Relieving Factors massager, hot shower    Effect of Pain on Daily Activities Limits ability to socialize, all activities are more difficult due to developing pain.                [facility] PT Assessment - 10/12/21 0001       Assessment   Medical Diagnosis Neck muscle spsam and pain    Referring Provider (PT) Gweneth Dimitri      Prior Function   Level of Independence Independent    Leisure Socialize with friends, attend the theater. Uses the computer a lot, reads the paper.      Posture/Postural Control   Posture Comments Patient demonstrates flattened thoracic curve with B winging scapula, R shoulder slightly elevated comparied to L.      ROM / Strength   AROM / PROM / Strength AROM;Strength      AROM   Overall AROM Comments Thoracic spine limited in all fields. BUE ROM WFL.    AROM Assessment Site Cervical;Thoracic  Cervical Flexion 80%    Cervical Extension 30%    Cervical - Right Side Bend 25%    Cervical - Left Side Bend 25%    Cervical - Right Rotation 30%    Cervical - Left Rotation 20%      Strength   Overall Strength Comments Cervical paraspinals, Upper Traps, LS al lshow atrophy. BUE strength WFL      Palpation   Palpation comment R rhomboid TP palpated, TTP                        Objective measurements completed on examination: See above findings.                PT Education - 10/12/21 1430     Education Details POC. Patient reports extensive HEP, demosntrated exercises including cervical ROM and strength, scapular stabilization.    Person(s) Educated Patient    Methods Explanation    Comprehension Verbalized understanding              PT Short Term Goals - 10/12/21 1444       PT SHORT TERM GOAL #1   Title Update current HEP-patient will perform Ily.    Baseline Has previous program    Time 4    Period Weeks     Status New    Target Date 11/09/21               PT Long Term Goals - 10/12/21 1445       PT LONG TERM GOAL #1   Title I with final HEP    Time 10    Period Weeks    Status New    Target Date 12/21/21      PT LONG TERM GOAL #2   Title Patient will demonstrate normalized thoracic spinal curve with decreased B scapulae winging.    Baseline flattened spinal curve, B scapulae wing moderately.    Time 10    Period Weeks    Status New    Target Date 12/21/21      PT LONG TERM GOAL #3   Title Improve painfree cervical ROM by up to 25% in all directions.    Baseline See flowsheets for measurements.    Time 10    Period Weeks    Status New    Target Date 12/21/21      PT LONG TERM GOAL #4   Title Patient will report the ability to work on her compute x 1 hour with cervical pain </=3/10    Baseline Unable to work without pain    Time 10    Period Weeks    Status New    Target Date 12/21/21                    Plan - 10/12/21 1432     Clinical Impression Statement Patient presents with chronic neck pain and muscle spasm. 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 Therapist unable to obtain X rays. She has had PT for this issue in the recent past. She reports some improvement, has an HEP which she performs throughout the day and gives her temporary relief. She demosntrates a flattened thoracic spine iwth winging scapula along iwth her decreased cervical ROM and pain. She will benefit from PT to mobilize her thoracic spine, strengthen postural muscles, and maximize painfree strength and ROM of her neck to imporve quality of life.    Personal  Factors and Comorbidities Age;Past/Current Experience;Comorbidity 1    Comorbidities arthritis    Examination-Activity Limitations Bathing;Reach Overhead;Sleep;Hygiene/Grooming;Lift    Examination-Participation Restrictions Interpersonal Relationship;Community Activity    Stability/Clinical Decision Making  Stable/Uncomplicated    Clinical Decision Making Low    Rehab Potential Good    PT Frequency 2x / week    PT Duration 8 weeks    PT Treatment/Interventions ADLs/Self Care Home Management;Ultrasound;Traction;Patient/family education;Manual techniques;Therapeutic exercise;Therapeutic activities;Dry needling;Passive range of motion    PT Next Visit Plan Thoracic mobilization    PT Home Exercise Plan Has extensive HEP from previous therapist    Consulted and Agree with Plan of Care Patient             Patient will benefit from skilled therapeutic intervention in order to improve the following deficits and impairments:  Decreased range of motion, Increased fascial restricitons, Pain, Decreased strength, Postural dysfunction, Improper body mechanics, Impaired flexibility  Visit Diagnosis: Radiculopathy, cervical region  Muscle weakness (generalized)     Problem List Patient Active Problem List   Diagnosis Date Noted   Esophageal spasm 07/30/2013   Atypical chest pain 07/30/2013   Hyperlipidemia    GERD (gastroesophageal reflux disease) 07/09/2013   LBBB (left bundle branch block) 07/09/2013    Iona Beard, DPT 10/12/2021, 2:51 PM  [facility] [address] [place], [place], [address] Phone: [phone]   Fax:  [phone]  Name: LANDA MULLINAX MRN: [id number] Date of Birth: 1941-03-11

## 2021-11-01 DIAGNOSIS — M542 Cervicalgia: Secondary | ICD-10-CM | POA: Diagnosis not present

## 2021-11-01 DIAGNOSIS — M47812 Spondylosis without myelopathy or radiculopathy, cervical region: Secondary | ICD-10-CM | POA: Diagnosis not present

## 2021-11-08 DIAGNOSIS — M47812 Spondylosis without myelopathy or radiculopathy, cervical region: Secondary | ICD-10-CM | POA: Diagnosis not present

## 2021-11-08 DIAGNOSIS — M542 Cervicalgia: Secondary | ICD-10-CM | POA: Diagnosis not present

## 2021-11-11 DIAGNOSIS — M47812 Spondylosis without myelopathy or radiculopathy, cervical region: Secondary | ICD-10-CM | POA: Diagnosis not present

## 2021-11-11 DIAGNOSIS — M542 Cervicalgia: Secondary | ICD-10-CM | POA: Diagnosis not present

## 2021-11-15 DIAGNOSIS — M47812 Spondylosis without myelopathy or radiculopathy, cervical region: Secondary | ICD-10-CM | POA: Diagnosis not present

## 2021-11-15 DIAGNOSIS — M542 Cervicalgia: Secondary | ICD-10-CM | POA: Diagnosis not present

## 2021-11-17 DIAGNOSIS — M542 Cervicalgia: Secondary | ICD-10-CM | POA: Diagnosis not present

## 2021-11-17 DIAGNOSIS — M47812 Spondylosis without myelopathy or radiculopathy, cervical region: Secondary | ICD-10-CM | POA: Diagnosis not present

## 2021-11-22 DIAGNOSIS — M47812 Spondylosis without myelopathy or radiculopathy, cervical region: Secondary | ICD-10-CM | POA: Diagnosis not present

## 2021-11-22 DIAGNOSIS — Z1231 Encounter for screening mammogram for malignant neoplasm of breast: Secondary | ICD-10-CM | POA: Diagnosis not present

## 2021-11-22 DIAGNOSIS — M542 Cervicalgia: Secondary | ICD-10-CM | POA: Diagnosis not present

## 2021-11-25 DIAGNOSIS — M542 Cervicalgia: Secondary | ICD-10-CM | POA: Diagnosis not present

## 2021-11-25 DIAGNOSIS — M47812 Spondylosis without myelopathy or radiculopathy, cervical region: Secondary | ICD-10-CM | POA: Diagnosis not present

## 2021-11-29 DIAGNOSIS — M542 Cervicalgia: Secondary | ICD-10-CM | POA: Diagnosis not present

## 2021-11-29 DIAGNOSIS — M47812 Spondylosis without myelopathy or radiculopathy, cervical region: Secondary | ICD-10-CM | POA: Diagnosis not present

## 2021-12-01 DIAGNOSIS — M542 Cervicalgia: Secondary | ICD-10-CM | POA: Diagnosis not present

## 2021-12-01 DIAGNOSIS — M47812 Spondylosis without myelopathy or radiculopathy, cervical region: Secondary | ICD-10-CM | POA: Diagnosis not present

## 2021-12-05 DIAGNOSIS — M47812 Spondylosis without myelopathy or radiculopathy, cervical region: Secondary | ICD-10-CM | POA: Diagnosis not present

## 2021-12-05 DIAGNOSIS — M542 Cervicalgia: Secondary | ICD-10-CM | POA: Diagnosis not present

## 2021-12-08 DIAGNOSIS — M542 Cervicalgia: Secondary | ICD-10-CM | POA: Diagnosis not present

## 2021-12-08 DIAGNOSIS — M47812 Spondylosis without myelopathy or radiculopathy, cervical region: Secondary | ICD-10-CM | POA: Diagnosis not present

## 2021-12-12 DIAGNOSIS — M542 Cervicalgia: Secondary | ICD-10-CM | POA: Diagnosis not present

## 2021-12-12 DIAGNOSIS — M47812 Spondylosis without myelopathy or radiculopathy, cervical region: Secondary | ICD-10-CM | POA: Diagnosis not present

## 2021-12-16 DIAGNOSIS — M47812 Spondylosis without myelopathy or radiculopathy, cervical region: Secondary | ICD-10-CM | POA: Diagnosis not present

## 2021-12-16 DIAGNOSIS — M542 Cervicalgia: Secondary | ICD-10-CM | POA: Diagnosis not present

## 2021-12-20 DIAGNOSIS — M47812 Spondylosis without myelopathy or radiculopathy, cervical region: Secondary | ICD-10-CM | POA: Diagnosis not present

## 2021-12-20 DIAGNOSIS — M542 Cervicalgia: Secondary | ICD-10-CM | POA: Diagnosis not present

## 2022-01-18 DIAGNOSIS — H04123 Dry eye syndrome of bilateral lacrimal glands: Secondary | ICD-10-CM | POA: Diagnosis not present

## 2022-01-18 DIAGNOSIS — Z961 Presence of intraocular lens: Secondary | ICD-10-CM | POA: Diagnosis not present

## 2022-01-18 DIAGNOSIS — H524 Presbyopia: Secondary | ICD-10-CM | POA: Diagnosis not present

## 2022-04-05 DIAGNOSIS — Z801 Family history of malignant neoplasm of trachea, bronchus and lung: Secondary | ICD-10-CM | POA: Diagnosis not present

## 2022-04-05 DIAGNOSIS — M1990 Unspecified osteoarthritis, unspecified site: Secondary | ICD-10-CM | POA: Diagnosis not present

## 2022-04-05 DIAGNOSIS — K5900 Constipation, unspecified: Secondary | ICD-10-CM | POA: Diagnosis not present

## 2022-04-05 DIAGNOSIS — Z8249 Family history of ischemic heart disease and other diseases of the circulatory system: Secondary | ICD-10-CM | POA: Diagnosis not present

## 2022-04-05 DIAGNOSIS — Z87892 Personal history of anaphylaxis: Secondary | ICD-10-CM | POA: Diagnosis not present

## 2022-04-05 DIAGNOSIS — E785 Hyperlipidemia, unspecified: Secondary | ICD-10-CM | POA: Diagnosis not present

## 2022-08-17 DIAGNOSIS — L84 Corns and callosities: Secondary | ICD-10-CM | POA: Diagnosis not present

## 2022-08-17 DIAGNOSIS — Z129 Encounter for screening for malignant neoplasm, site unspecified: Secondary | ICD-10-CM | POA: Diagnosis not present

## 2022-08-17 DIAGNOSIS — L821 Other seborrheic keratosis: Secondary | ICD-10-CM | POA: Diagnosis not present

## 2022-08-17 DIAGNOSIS — L814 Other melanin hyperpigmentation: Secondary | ICD-10-CM | POA: Diagnosis not present

## 2022-08-17 DIAGNOSIS — X32XXXS Exposure to sunlight, sequela: Secondary | ICD-10-CM | POA: Diagnosis not present

## 2022-08-17 DIAGNOSIS — L920 Granuloma annulare: Secondary | ICD-10-CM | POA: Diagnosis not present

## 2022-10-31 DIAGNOSIS — M8588 Other specified disorders of bone density and structure, other site: Secondary | ICD-10-CM | POA: Diagnosis not present

## 2022-10-31 DIAGNOSIS — E785 Hyperlipidemia, unspecified: Secondary | ICD-10-CM | POA: Diagnosis not present

## 2022-11-06 DIAGNOSIS — K5900 Constipation, unspecified: Secondary | ICD-10-CM | POA: Diagnosis not present

## 2022-11-06 DIAGNOSIS — M8588 Other specified disorders of bone density and structure, other site: Secondary | ICD-10-CM | POA: Diagnosis not present

## 2022-11-06 DIAGNOSIS — Z6820 Body mass index (BMI) 20.0-20.9, adult: Secondary | ICD-10-CM | POA: Diagnosis not present

## 2022-11-06 DIAGNOSIS — Z0000 Encounter for general adult medical examination without abnormal findings: Secondary | ICD-10-CM | POA: Diagnosis not present

## 2022-11-06 DIAGNOSIS — M47812 Spondylosis without myelopathy or radiculopathy, cervical region: Secondary | ICD-10-CM | POA: Diagnosis not present

## 2022-11-06 DIAGNOSIS — R7401 Elevation of levels of liver transaminase levels: Secondary | ICD-10-CM | POA: Diagnosis not present

## 2022-11-06 DIAGNOSIS — I779 Disorder of arteries and arterioles, unspecified: Secondary | ICD-10-CM | POA: Diagnosis not present

## 2022-11-06 DIAGNOSIS — E785 Hyperlipidemia, unspecified: Secondary | ICD-10-CM | POA: Diagnosis not present

## 2022-11-06 DIAGNOSIS — Z23 Encounter for immunization: Secondary | ICD-10-CM | POA: Diagnosis not present

## 2022-11-07 ENCOUNTER — Other Ambulatory Visit: Payer: Self-pay | Admitting: Family Medicine

## 2022-11-07 DIAGNOSIS — I779 Disorder of arteries and arterioles, unspecified: Secondary | ICD-10-CM

## 2022-11-10 DIAGNOSIS — M85851 Other specified disorders of bone density and structure, right thigh: Secondary | ICD-10-CM | POA: Diagnosis not present

## 2022-11-10 DIAGNOSIS — Z780 Asymptomatic menopausal state: Secondary | ICD-10-CM | POA: Diagnosis not present

## 2022-11-10 DIAGNOSIS — M810 Age-related osteoporosis without current pathological fracture: Secondary | ICD-10-CM | POA: Diagnosis not present

## 2022-11-28 ENCOUNTER — Other Ambulatory Visit: Payer: Medicare PPO

## 2022-12-04 DIAGNOSIS — R7401 Elevation of levels of liver transaminase levels: Secondary | ICD-10-CM | POA: Diagnosis not present

## 2022-12-07 ENCOUNTER — Ambulatory Visit
Admission: RE | Admit: 2022-12-07 | Discharge: 2022-12-07 | Disposition: A | Discharge status: Home / Self Care | Payer: Medicare PPO | Source: Ambulatory Visit | Attending: Family Medicine | Admitting: Family Medicine

## 2022-12-07 DIAGNOSIS — I6523 Occlusion and stenosis of bilateral carotid arteries: Secondary | ICD-10-CM | POA: Diagnosis not present

## 2022-12-07 DIAGNOSIS — I779 Disorder of arteries and arterioles, unspecified: Secondary | ICD-10-CM

## 2023-02-05 DIAGNOSIS — H52203 Unspecified astigmatism, bilateral: Secondary | ICD-10-CM | POA: Diagnosis not present

## 2023-02-05 DIAGNOSIS — H26493 Other secondary cataract, bilateral: Secondary | ICD-10-CM | POA: Diagnosis not present

## 2023-02-05 DIAGNOSIS — Z961 Presence of intraocular lens: Secondary | ICD-10-CM | POA: Diagnosis not present

## 2023-11-09 DIAGNOSIS — M8588 Other specified disorders of bone density and structure, other site: Secondary | ICD-10-CM | POA: Diagnosis not present

## 2023-11-09 DIAGNOSIS — E785 Hyperlipidemia, unspecified: Secondary | ICD-10-CM | POA: Diagnosis not present

## 2023-11-09 DIAGNOSIS — R7989 Other specified abnormal findings of blood chemistry: Secondary | ICD-10-CM | POA: Diagnosis not present

## 2023-11-09 DIAGNOSIS — R946 Abnormal results of thyroid function studies: Secondary | ICD-10-CM | POA: Diagnosis not present

## 2023-11-14 DIAGNOSIS — Z01419 Encounter for gynecological examination (general) (routine) without abnormal findings: Secondary | ICD-10-CM | POA: Diagnosis not present

## 2023-11-14 DIAGNOSIS — Z1331 Encounter for screening for depression: Secondary | ICD-10-CM | POA: Diagnosis not present

## 2023-11-14 DIAGNOSIS — M8588 Other specified disorders of bone density and structure, other site: Secondary | ICD-10-CM | POA: Diagnosis not present

## 2023-11-14 DIAGNOSIS — K5909 Other constipation: Secondary | ICD-10-CM | POA: Diagnosis not present

## 2023-11-14 DIAGNOSIS — E038 Other specified hypothyroidism: Secondary | ICD-10-CM | POA: Diagnosis not present

## 2023-11-14 DIAGNOSIS — Z0000 Encounter for general adult medical examination without abnormal findings: Secondary | ICD-10-CM | POA: Diagnosis not present

## 2023-11-14 DIAGNOSIS — I6529 Occlusion and stenosis of unspecified carotid artery: Secondary | ICD-10-CM | POA: Diagnosis not present

## 2023-11-14 DIAGNOSIS — Z6820 Body mass index (BMI) 20.0-20.9, adult: Secondary | ICD-10-CM | POA: Diagnosis not present

## 2023-11-14 DIAGNOSIS — E785 Hyperlipidemia, unspecified: Secondary | ICD-10-CM | POA: Diagnosis not present

## 2023-11-28 DIAGNOSIS — Z1231 Encounter for screening mammogram for malignant neoplasm of breast: Secondary | ICD-10-CM | POA: Diagnosis not present

## 2023-12-17 ENCOUNTER — Other Ambulatory Visit: Payer: Self-pay

## 2023-12-17 DIAGNOSIS — I6523 Occlusion and stenosis of bilateral carotid arteries: Secondary | ICD-10-CM

## 2023-12-20 DIAGNOSIS — D2272 Melanocytic nevi of left lower limb, including hip: Secondary | ICD-10-CM | POA: Diagnosis not present

## 2023-12-20 DIAGNOSIS — L821 Other seborrheic keratosis: Secondary | ICD-10-CM | POA: Diagnosis not present

## 2023-12-20 DIAGNOSIS — L920 Granuloma annulare: Secondary | ICD-10-CM | POA: Diagnosis not present

## 2023-12-27 NOTE — Progress Notes (Unsigned)
   Patient ID: KAISEY HUSEBY, female   DOB: [birth year], 83 y.o.   MRN: [id number]  Reason for Consult: No chief complaint on file.   Referred by Gweneth Dimitri, MD  Subjective:     HPI  SANAA ZILBERMAN is a 83 y.o. female seen for evaluation of carotid artery disease.  She is retired and used to work as a ***.  She denies any previous strokes or strokelike symptoms.  Specifically she denies one-sided weakness, numbness, amaurosis or speech issues.  Past Medical History:  Diagnosis Date   Chest pain    Hyperlipidemia    LBBB (left bundle branch block)    Nasal polyp    Pyloric stenosis    Family History  Problem Relation Age of Onset   AAA (abdominal aortic aneurysm) Mother        weight   Hypertension Mother    CVA Mother    Lung cancer Father    Past Surgical History:  Procedure Laterality Date   NASAL POLYP SURGERY  2009   pyloric stenosis repair  61    Short Social History:  Social History   Tobacco Use   Smoking status: Never   Smokeless tobacco: Not on file  Substance Use Topics   Alcohol use: Yes    Comment: social    Allergies  Allergen Reactions   Codeine     hallucinations    Current Outpatient Medications  Medication Sig Dispense Refill   Ascorbic Acid (VITAMIN C PO) Take 1 tablet by mouth daily.     nitroGLYCERIN (NITROSTAT) 0.4 MG SL tablet Place 1 tablet (0.4 mg total) under the tongue every 5 (five) minutes as needed for chest pain. 30 tablet 5   Pyridoxine HCl (VITAMIN B-6 PO) Take 1 tablet by mouth daily.     No current facility-administered medications for this visit.    REVIEW OF SYSTEMS  Positive for ***  All other systems were reviewed and are negative     Objective:  Objective   There were no vitals filed for this visit. There is no height or weight on file to calculate BMI.  Physical Exam General: no acute distress Cardiac: hemodynamically stable Pulm: normal work of breathing Abdomen: non-tender, no pulsatile  mass*** Neuro: alert, no focal deficit Extremities: no edema, cyanosis or wounds*** Vascular:   Right: ***  Left: ***   Data: Carotid duplex ***  Carotid ultrasound from 2024 reviewed, no significant stenosis was noted in the vertebral arteries.  To be antegrade.     Assessment/Plan:     ALLEAN MONTFORT is a 83 y.o. female with asymptomatic carotid stenosis, right ***, left ***. We discussed the natural history of carotid disease as well as its risk factors.  Explained that the threshold for treatment in asymptomatic patients is a stenosis greater than 80%.  Will plan to continue surveillance. Recommended aspirin and statin. Repeat carotid duplex in *** years    Recommendations to optimize cardiovascular risk: Abstinence from all tobacco products. Blood glucose control with goal A1c < 7%. Blood pressure control with goal blood pressure < 140/90 mmHg. Lipid reduction therapy with goal LDL-C <100 mg/dL  Aspirin 81mg  PO QD.  Atorvastatin 40-80mg  PO QD (or other "high intensity" statin therapy).     Daria Pastures MD [facility]

## 2023-12-28 ENCOUNTER — Encounter: Payer: Self-pay | Admitting: Vascular Surgery

## 2023-12-28 ENCOUNTER — Ambulatory Visit: Admitting: Vascular Surgery

## 2023-12-28 ENCOUNTER — Ambulatory Visit (HOSPITAL_COMMUNITY)
Admission: RE | Admit: 2023-12-28 | Discharge: 2023-12-28 | Disposition: A | Discharge status: Home / Self Care | Source: Ambulatory Visit | Attending: Vascular Surgery | Admitting: Vascular Surgery

## 2023-12-28 VITALS — BP 134/81 | HR 69 | Temp 97.6°F | Resp 18 | Ht 63.0 in | Wt 113.3 lb

## 2023-12-28 DIAGNOSIS — I6523 Occlusion and stenosis of bilateral carotid arteries: Secondary | ICD-10-CM | POA: Insufficient documentation

## 2024-01-03 DIAGNOSIS — I779 Disorder of arteries and arterioles, unspecified: Secondary | ICD-10-CM | POA: Diagnosis not present

## 2024-01-03 DIAGNOSIS — Z6820 Body mass index (BMI) 20.0-20.9, adult: Secondary | ICD-10-CM | POA: Diagnosis not present

## 2024-02-07 DIAGNOSIS — H524 Presbyopia: Secondary | ICD-10-CM | POA: Diagnosis not present

## 2024-02-07 DIAGNOSIS — H26493 Other secondary cataract, bilateral: Secondary | ICD-10-CM | POA: Diagnosis not present

## 2024-02-07 DIAGNOSIS — Z961 Presence of intraocular lens: Secondary | ICD-10-CM | POA: Diagnosis not present

## 2024-02-07 DIAGNOSIS — H52203 Unspecified astigmatism, bilateral: Secondary | ICD-10-CM | POA: Diagnosis not present

## 2024-03-13 DIAGNOSIS — H26493 Other secondary cataract, bilateral: Secondary | ICD-10-CM | POA: Diagnosis not present

## 2024-05-14 DIAGNOSIS — E038 Other specified hypothyroidism: Secondary | ICD-10-CM | POA: Diagnosis not present

## 2024-06-17 DIAGNOSIS — M7742 Metatarsalgia, left foot: Secondary | ICD-10-CM | POA: Diagnosis not present

## 2024-06-17 DIAGNOSIS — M2022 Hallux rigidus, left foot: Secondary | ICD-10-CM | POA: Diagnosis not present

## 2024-07-30 DIAGNOSIS — G5762 Lesion of plantar nerve, left lower limb: Secondary | ICD-10-CM | POA: Diagnosis not present

## 2024-07-30 DIAGNOSIS — M2022 Hallux rigidus, left foot: Secondary | ICD-10-CM | POA: Diagnosis not present
